# Patient Record
Sex: Female | Born: 1990 | Race: White | Hispanic: No | Marital: Single | Smoking: Former smoker
Health system: Southern US, Community
[De-identification: ages and names within clinical notes are randomized; demographics above are authoritative.]

## PROBLEM LIST (undated history)

## (undated) DIAGNOSIS — Z789 Other specified health status: Secondary | ICD-10-CM

## (undated) HISTORY — DX: Other specified health status: Z78.9

---

## 2015-05-31 ENCOUNTER — Encounter (INDEPENDENT_AMBULATORY_CARE_PROVIDER_SITE_OTHER): Payer: Self-pay

## 2015-05-31 ENCOUNTER — Ambulatory Visit (INDEPENDENT_AMBULATORY_CARE_PROVIDER_SITE_OTHER): Payer: 59 | Admitting: Physician Assistant

## 2015-05-31 VITALS — BP 114/55 | HR 70 | Temp 98.6°F | Resp 18 | Ht 73.0 in | Wt 230.0 lb

## 2015-05-31 DIAGNOSIS — K1379 Other lesions of oral mucosa: Secondary | ICD-10-CM

## 2015-05-31 MED ORDER — AMOXICILLIN 500 MG PO CAPS
500.0000 mg | ORAL_CAPSULE | Freq: Two times a day (BID) | ORAL | Status: AC
Start: 2015-05-31 — End: 2015-06-10

## 2015-05-31 NOTE — Progress Notes (Signed)
Subjective:    Patient ID: Diane Le is a 24 y.o. female.    HPI had tooth infection left upper 1st molar recently. Has been seen by a dentist. Since Sunday, her left cheek where it is close to the infected left upper 3rd molar became infection. The affected area is thickened and painful. No fever or other associated sym.       No Known Allergies    Past Medical History   Diagnosis Date   . No known health problems        No current outpatient prescriptions on file prior to visit.     No current facility-administered medications on file prior to visit.       The following portions of the patient's history were reviewed and updated as appropriate: allergies, current medications, past family history, past medical history, past social history, past surgical history and problem list.    Review of Systems   Constitutional: Negative for fever and fatigue.   HENT: Positive for dental problem.         Left cheek pain.    Respiratory: Negative for apnea, cough, choking, chest tightness, shortness of breath, wheezing and stridor.    Cardiovascular: Negative for chest pain and palpitations.   Gastrointestinal: Negative for abdominal pain.   Neurological: Negative for dizziness and headaches.         Objective:    BP 114/55 mmHg  Pulse 70  Temp(Src) 98.6 F (37 C) (Oral)  Resp 18  Ht 1.854 m (6\' 1" )  Wt 104.327 kg (230 lb)  BMI 30.35 kg/m2  LMP 05/24/2015 (Approximate)    Physical Exam   Constitutional: She appears well-developed and well-nourished.   HENT:   Head: Normocephalic and atraumatic.   Mouth/Throat:       Cardiovascular: Normal rate.    Pulmonary/Chest: Effort normal. No respiratory distress.   Neurological: She is alert.   Psychiatric: She has a normal mood and affect.   Nursing note and vitals reviewed.       Lab Results from today's visit:  No results found for this or any previous visit (from the past 12 hour(s)).    Radiology Results from today's visit:  No results found.           Assessment and Plan:       Diane Le was seen today for dental pain.    Diagnoses and all orders for this visit:    Infection of buccal space  -     amoxicillin (AMOXIL) 500 MG capsule; Take 1 capsule (500 mg total) by mouth 2 (two) times daily.    - will FU with dentist ASAP,           Please go to ER if symptoms persist, worsen or new symptoms develop. Follow up with your Primary Care Physician or Return to Clinic as needed. Patient/Family verbalizes understanding.    Danella Deis, PA  [facility]  05/31/2015  12:51 PM

## 2015-05-31 NOTE — Patient Instructions (Signed)
Please go to ER if symptoms persist, worsen or new symptoms develop. Follow up with your Primary Care Physician or Return to Clinic as needed. Patient/Family verbalizes understanding.      Understanding Healthy Teeth and Gums  When you look at your mouth in the mirror, you can see hard white teeth surrounded by soft gums. What you likely can't see is a sticky coating of bacteria and other substances on your teeth and gums. This coating, called plaque, can harm your mouth if it's not kept under control.    Healthy Teeth and Gums  To have good oral health, you musthave healthy teeth and gums.   Teeth are made of hard tissuedesigned to break up food. Healthy teeth can be various shades of white (some staining on the teeth is normal). Teeth are set into the supporting bone of the jaw.   Gums are soft tissues that cover bone and part of each tooth. Thecolor of your gums depends on your ethnicity. But, your gums should be the same color throughout your mouth.  When Plaque and Tartar Form  Even in a healthy mouth, plaque forms. If not cleared away with daily brushing and flossing, this sticky film coats the teeth, gums, and tongue.   As saliva and the tongue movein the mouth, some plaque is wiped off the tooth surfaces. But plaque can collect in the grooves of the teeth, between teeth, and at the gumline.   Plaque bacteria feed on bits of sugary and starchy foods left in your mouth after you eat. This results in acid, the main cause of tooth decay.   If not removed, plaque hardens intotartar (also called calculus). Tartar can spread below the gumline, where it damages the gums and bone.  Are You At Risk?  Some factors make you more likely to have problems with your teeth and gums. These include:   Not taking good care of your teeth and gums.   A low amount of saliva in the mouth, which allows plaque to collect.   Smoking, which makes your body lessable to fight infections such as gum disease. Smoking also  reduces the amount of saliva in your mouth.   Eating a lot of sugary and starchy foods,which causes more acid to form.   Frequent snacking, which lets acid form more often.   Crooked teeth, which can be harder to clean.   [address] [employer] [address], [place], [place] [address]. All rights reserved. This information is not intended as a substitute for professional medical care. Always follow your healthcare professional's instructions.

## 2015-06-01 ENCOUNTER — Telehealth (INDEPENDENT_AMBULATORY_CARE_PROVIDER_SITE_OTHER): Payer: Self-pay

## 2015-06-01 ENCOUNTER — Encounter (INDEPENDENT_AMBULATORY_CARE_PROVIDER_SITE_OTHER): Payer: Self-pay

## 2015-06-01 ENCOUNTER — Ambulatory Visit (INDEPENDENT_AMBULATORY_CARE_PROVIDER_SITE_OTHER): Payer: 59 | Admitting: Physician Assistant

## 2015-06-01 VITALS — BP 119/51 | HR 89 | Temp 98.6°F | Resp 18 | Ht 73.0 in | Wt 230.0 lb

## 2015-06-01 DIAGNOSIS — K1379 Other lesions of oral mucosa: Secondary | ICD-10-CM

## 2015-06-01 DIAGNOSIS — L0201 Cutaneous abscess of face: Secondary | ICD-10-CM

## 2015-06-01 DIAGNOSIS — Z5189 Encounter for other specified aftercare: Secondary | ICD-10-CM

## 2015-06-01 MED ORDER — CLINDAMYCIN HCL 300 MG PO CAPS
300.0000 mg | ORAL_CAPSULE | Freq: Three times a day (TID) | ORAL | Status: AC
Start: 2015-06-01 — End: 2015-06-08

## 2015-06-01 NOTE — Progress Notes (Signed)
Subjective:    Patient ID: Diane Le is a 24 y.o. female.    Abscess  This is a new problem. Episode onset: pt seen in clinic yesterday. The problem occurs constantly. The problem has been waxing and waning. Pertinent negatives include no chills, fever, nausea or vomiting. Exacerbated by: palpation.   Pt woke this morning with swelling from left cheek up to just under left eye. Pt applied warm tea bag to abscess in gum line and felt a popping sensation followed by purulent and bloody drainage. Pt reports moderate improvement in swelling that is returning since drainage occurred. Denies F/C/N/V/ vision changes. Pt has taken 3 doses of antibiotic along with tylenol/ibuprofen with minimal relief.    The following portions of the patient's history were reviewed and updated as appropriate: allergies, current medications, past family history, past medical history, past social history, past surgical history and problem list.    Review of Systems   Constitutional: Negative for fever and chills.   HENT: Positive for dental problem and facial swelling. Negative for ear pain.    Gastrointestinal: Negative for nausea and vomiting.   All other systems reviewed and are negative.        Objective:    BP 119/51 mmHg  Pulse 89  Temp(Src) 98.6 F (37 C) (Oral)  Resp 18  Ht 1.854 m (6\' 1" )  Wt 104.327 kg (230 lb)  BMI 30.35 kg/m2  LMP 05/24/2015 (Approximate)    Physical Exam   Constitutional: She is oriented to person, place, and time. She appears well-developed and well-nourished. No distress.   HENT:   Head:       Mouth/Throat: Uvula is midline.       Cardiovascular: Normal rate.    Pulmonary/Chest: Effort normal.   Neurological: She is alert and oriented to person, place, and time.   Skin: Skin is warm and dry. She is not diaphoretic.   Psychiatric: She has a normal mood and affect. Her behavior is normal.   Nursing note and vitals reviewed.       Labs  No results found for this or any previous visit  (from the past 24 hour(s)).    Radiology  No results found.        Assessment and Plan:       Naveah was seen today for abscess.    Diagnoses and all orders for this visit:    Abscess, cheek  -     clindamycin (CLEOCIN) 300 MG capsule; Take 1 capsule (300 mg total) by mouth 3 (three) times daily.    -will stop amoxicillin, start clindamycin  -will continue warm compresses supporting spontaneous drainage  -Follow up with PCP if symptoms lasting longer than expected  -RTC or ER if new/worsening symptoms  -Pt/guardian agreed to plan          Allie Bossier, PA  [facility]  06/01/2015  8:04 PM

## 2015-06-01 NOTE — Telephone Encounter (Signed)
-----   Message from Lolita Cram sent at 06/01/2015 11:23 AM EST -----  Contact: [phone]  Pt called stating she thinks the medicine isn't helping. Underneath her eye is now swollen.

## 2015-06-01 NOTE — Patient Instructions (Signed)
Follow up with your Primary Care Physician or Return to Clinic if symptoms persist or worsen. Patient/Family verbalizes understanding.    Abscess (Antibiotic Treatment Only)  An abscess (sometimes called a "boil") occurs when bacteria get trapped under the skin and begin to grow. Pus forms inside the abscess as the body responds to the bacteria. An abscess can occur with an insect bite, ingrown hair, blocked oil gland, pimple, cyst, or puncture wound.  In the early stages, redness and tenderness are the only symptoms. Sometimes, this stage can be treated with antibiotics alone. If the abscess does not respond to antibiotic treatment, it will need to be drained with a small cut, under local anesthesia.  Home care  The following will help you care for your abscess at home:   Soak the wound in hot water or apply hot packs (small towel soaked in hot water) to the area for 20 minutes at a time. Do this three to four times a day.   Apply antibiotic cream or ointment onto the skin 3-4 times a day, unless something else was prescribed. Some ointments include an antibiotic plus a local pain reliever.   If your doctor prescribed antibiotics, do not stop taking this medication until you have finished the prescribed course or the doctor tells you to stop.   You may use an over-the-counter pain medication to control pain, unless another pain medicine was prescribed. If you have chronic liver or kidney disease or ever had a stomach ulcer or GI bleeding, talk with your doctor before using these any of these.  Follow-up care  Follow up with your health care provider as advised by our staff. Look at your wound each day for the signs of worsening infection listed below.  When to seek medical advice  Call your health care provider right away if any of these occur:   An increase in redness or swelling   Red streaks in the skin leading away from the abscess   An increase in local pain or swelling   Fever of 100.85F (38C) or  higher, or as directed by your health care provider   Pus or fluid coming from the abscess   2000-2015 [employer] [address] [place], [place] [address]. All rights reserved. This information is not intended as a substitute for professional medical care. Always follow your healthcare professional's instructions.

## 2015-06-01 NOTE — Telephone Encounter (Signed)
Spoke with patient states she is having increased swelling under left eye and left upper jaw pain. States pain is a 7 out of 10. Patient is applying warm compresses and salt water rinses. Patient has taken 3 doses of Amoxicillin. She is asking if the antibiotic should be changed since she isnt feeling much better. Will discuss with provider and call patient back today.

## 2015-06-01 NOTE — Telephone Encounter (Signed)
Advised by Dr Carmela Hurt to have patient come back to clinic to be re-evaluated. Spoke with patient verbalized understanding and is planning on returning to clinic today. Atlee Abide

## 2015-06-04 ENCOUNTER — Telehealth (INDEPENDENT_AMBULATORY_CARE_PROVIDER_SITE_OTHER): Payer: Self-pay

## 2015-06-04 NOTE — Telephone Encounter (Signed)
Called and left message with patient to give us a call back with any questions or concerns

## 2015-06-05 ENCOUNTER — Telehealth (INDEPENDENT_AMBULATORY_CARE_PROVIDER_SITE_OTHER): Payer: Self-pay

## 2015-06-05 NOTE — Telephone Encounter (Signed)
Patient answered call. Patient stated she's "doing wonderful" and the medication regimen is working great. Patient advised to call/come back to office with any concerns.  Autumn N Bunch  11:14 AM

## 2016-12-22 ENCOUNTER — Encounter: Payer: Self-pay | Admitting: Advanced Practice Midwife

## 2017-02-16 ENCOUNTER — Encounter: Payer: Self-pay | Admitting: *Deleted

## 2017-02-16 LAB — PREGNANCY, URINE: Preg Test, Ur: POSITIVE

## 2017-02-18 ENCOUNTER — Encounter: Payer: Self-pay | Admitting: Obstetrics and Gynecology

## 2017-02-18 ENCOUNTER — Ambulatory Visit (INDEPENDENT_AMBULATORY_CARE_PROVIDER_SITE_OTHER): Payer: Medicaid Other | Admitting: Obstetrics and Gynecology

## 2017-02-18 ENCOUNTER — Other Ambulatory Visit (HOSPITAL_COMMUNITY)
Admission: RE | Admit: 2017-02-18 | Discharge: 2017-02-18 | Disposition: A | Discharge status: Home / Self Care | Payer: Medicaid Other | Source: Ambulatory Visit | Attending: Obstetrics and Gynecology | Admitting: Obstetrics and Gynecology

## 2017-02-18 DIAGNOSIS — Z113 Encounter for screening for infections with a predominantly sexual mode of transmission: Secondary | ICD-10-CM

## 2017-02-18 DIAGNOSIS — Z3480 Encounter for supervision of other normal pregnancy, unspecified trimester: Secondary | ICD-10-CM | POA: Insufficient documentation

## 2017-02-18 DIAGNOSIS — O0930 Supervision of pregnancy with insufficient antenatal care, unspecified trimester: Secondary | ICD-10-CM | POA: Insufficient documentation

## 2017-02-18 DIAGNOSIS — O0932 Supervision of pregnancy with insufficient antenatal care, second trimester: Secondary | ICD-10-CM | POA: Diagnosis not present

## 2017-02-18 DIAGNOSIS — O99210 Obesity complicating pregnancy, unspecified trimester: Secondary | ICD-10-CM

## 2017-02-18 DIAGNOSIS — Z3400 Encounter for supervision of normal first pregnancy, unspecified trimester: Secondary | ICD-10-CM

## 2017-02-18 DIAGNOSIS — Z23 Encounter for immunization: Secondary | ICD-10-CM

## 2017-02-18 DIAGNOSIS — O99212 Obesity complicating pregnancy, second trimester: Secondary | ICD-10-CM

## 2017-02-18 DIAGNOSIS — E669 Obesity, unspecified: Secondary | ICD-10-CM | POA: Insufficient documentation

## 2017-02-18 LAB — POCT URINALYSIS DIP (DEVICE)
Bilirubin Urine: NEGATIVE
GLUCOSE, UA: NEGATIVE mg/dL
Hgb urine dipstick: NEGATIVE
Ketones, ur: NEGATIVE mg/dL
NITRITE: NEGATIVE
PH: 6 (ref 5.0–8.0)
PROTEIN: NEGATIVE mg/dL
Specific Gravity, Urine: >=1.03 (ref 1.005–1.030)
UROBILINOGEN UA: 1 mg/dL (ref 0.0–1.0)

## 2017-02-18 NOTE — Progress Notes (Signed)
New OB Note  02/18/2017   [facility]  Chief Complaint: NOB  Transfer of Care Patient: no  History of Present Illness: Ms. Shannon Clark is a 26 y.o. G2P0010 @ 20/5 weeks (EDC 1/25, based on Patient's last menstrual period was 09/26/2016.=[facility]  Preg complicated by has Supervision of normal first pregnancy; Late prenatal care affecting pregnancy; Obesity (BMI 30-39.9); and Obesity in pregnancy on her problem list.   Any events prior to today's visit: no Her periods were: qmonth and regular She was using no method when she conceived.  She has Negative signs or symptoms of nausea/vomiting of pregnancy. She has Negative signs or symptoms of miscarriage or preterm labor On any medications around the time she conceived/early pregnancy: No   ROS: A 12-point review of systems was performed and negative, except as stated in the above HPI.  OBGYN History: As per HPI. OB History  Gravida Para Term Preterm AB Living  2       1    SAB TAB Ectopic Multiple Live Births  1            # Outcome Date GA Lbr Len/2nd Weight Sex Delivery Anes PTL Lv  2 Current           1 SAB 08/2016 [id number]             Any issues with any prior pregnancies: not applicable Prior children are healthy, doing well, and without any problems or issues: not applicable History of pap smears: Yes. Last pap smear unsure and results were negative   Past Medical History: History reviewed. No pertinent past medical history.  Past Surgical History: History reviewed. No pertinent surgical history.  Family History:  History reviewed. No pertinent family history. She denies any history of mental retardation, birth defects or genetic disorders in her or the FOB's history  Social History:  Social History   Social History  . Marital status: Single    Spouse name: N/A  . Number of children: N/A  . Years of education: N/A   Occupational History  .  Not on file.   Social History Main Topics  . Smoking status: Former Games developermoker  . Smokeless tobacco: Never Used     Comment: very little in college  . Alcohol use No  . Drug use: No  . Sexual activity: Yes    Birth control/ protection: None   Other Topics Concern  . Not on file   Social History Narrative  . No narrative on file    Allergy: No Known Allergies  Health Maintenance:  Mammogram Up to Date: not applicable  Current Outpatient Medications: PNV  Physical Exam:   BP 121/69   Pulse 76   Ht 6' (1.829 m)   Wt 245 lb 3.2 oz (111.2 kg)   LMP 09/26/2016   BMI 33.26 kg/m  Body mass index is 33.26 kg/m. Contractions: Not present Vag. Bleeding: None. Fundal height: 20 FHTs: 140s  General appearance: Well nourished, well developed female in no acute distress.  Neck:  Supple, normal appearance, and no thyromegaly  Cardiovascular: S1, S2 normal, no murmur, rub or gallop, regular rate and rhythm Respiratory:  Clear to auscultation bilateral. Normal respiratory effort Abdomen: positive bowel sounds and no masses, hernias; diffusely non tender to palpation, non distended Breasts: deferred (pt denies any s/s) Neuro/Psych:  Normal mood and affect.  Skin:  Warm and dry.  Lymphatic:  No inguinal lymphadenopathy.   Pelvic  exam: is not limited by body habitus EGBUS: within normal limits, Vagina: within normal limits and with no blood in the vault, Cervix: normal appearing cervix without discharge or lesions, closed/long/high, Uterus:  enlarged, c/w 20 week size, and Adnexa:  normal adnexa and no mass, fullness, tenderness  Laboratory: none  Imaging:  8/2 u/s put her at 14/6  Assessment: pt doing well  Plan: 1. Supervision of normal first pregnancy, antepartum Routine care. Declines afp tetra - Obstetric Panel, Including HIV - Culture, OB Urine - Enroll patient in Babyscripts Program - Cytology - PAP - Hemoglobinopathy evaluation - Cystic fibrosis gene test -  SMN1 Copy Number Analysis - [place] MFM OB DETAIL +14 WK; Future  2. Late prenatal care affecting pregnancy in second trimester  3. Obesity (BMI 30-39.9)  4. Obesity in pregnancy - CMP and Liver - TSH - Protein / Creatinine Ratio, Urine - HgB A1c  Problem list reviewed and updated.  Follow up in 3 weeks.  The nature of [facility] with multiple MDs and other Advanced Practice Providers was explained to patient; also emphasized that residents, students are part of our team.  >50% of 20 min visit spent on counseling and coordination of care.     Cornelia Copa MD Attending [facility])

## 2017-02-18 NOTE — Progress Notes (Signed)
Here for initial prenatal visit. Given new patient education booklets. Signed up for babyscripps app. Declines genetic testing.

## 2017-02-19 LAB — CYTOLOGY - PAP
CHLAMYDIA, DNA PROBE: NEGATIVE
DIAGNOSIS: NEGATIVE
NEISSERIA GONORRHEA: NEGATIVE
Trichomonas: NEGATIVE

## 2017-02-19 LAB — PROTEIN / CREATININE RATIO, URINE
CREATININE, UR: 214.5 mg/dL
Protein, Ur: 13.9 mg/dL
Protein/Creat Ratio: 65 mg/g creat (ref 0–200)

## 2017-02-19 NOTE — Addendum Note (Signed)
Addended by: Gerome ApleyZEYFANG, LINDA L on: 02/19/2017 09:59 AM   Modules accepted: Orders

## 2017-02-20 LAB — URINE CULTURE, OB REFLEX

## 2017-02-20 LAB — CULTURE, OB URINE

## 2017-02-24 ENCOUNTER — Ambulatory Visit (HOSPITAL_COMMUNITY)
Admission: RE | Admit: 2017-02-24 | Discharge: 2017-02-24 | Disposition: A | Discharge status: Home / Self Care | Payer: Medicaid Other | Source: Ambulatory Visit | Attending: Obstetrics and Gynecology | Admitting: Obstetrics and Gynecology

## 2017-02-24 ENCOUNTER — Other Ambulatory Visit: Payer: Self-pay | Admitting: Obstetrics and Gynecology

## 2017-02-24 DIAGNOSIS — Z363 Encounter for antenatal screening for malformations: Secondary | ICD-10-CM | POA: Insufficient documentation

## 2017-02-24 DIAGNOSIS — O0932 Supervision of pregnancy with insufficient antenatal care, second trimester: Secondary | ICD-10-CM

## 2017-02-24 DIAGNOSIS — Z3482 Encounter for supervision of other normal pregnancy, second trimester: Secondary | ICD-10-CM | POA: Diagnosis not present

## 2017-02-24 DIAGNOSIS — Z3A21 21 weeks gestation of pregnancy: Secondary | ICD-10-CM | POA: Insufficient documentation

## 2017-02-24 DIAGNOSIS — Z3689 Encounter for other specified antenatal screening: Secondary | ICD-10-CM | POA: Diagnosis present

## 2017-02-24 DIAGNOSIS — Z369 Encounter for antenatal screening, unspecified: Secondary | ICD-10-CM

## 2017-02-24 DIAGNOSIS — Z3400 Encounter for supervision of normal first pregnancy, unspecified trimester: Secondary | ICD-10-CM

## 2017-02-25 LAB — CMP AND LIVER
ALK PHOS: 46 IU/L (ref 39–117)
ALT: 10 IU/L (ref 0–32)
AST: 13 IU/L (ref 0–40)
Albumin: 3.8 g/dL (ref 3.5–5.5)
BUN: 7 mg/dL (ref 6–20)
Bilirubin, Direct: 0.07 mg/dL (ref 0.00–0.40)
CHLORIDE: 102 mmol/L (ref 96–106)
CO2: 23 mmol/L (ref 20–29)
CREATININE: 0.54 mg/dL — AB (ref 0.57–1.00)
Calcium: 8.9 mg/dL (ref 8.7–10.2)
GFR calc Af Amer: 152 mL/min/{1.73_m2} (ref >59)
GFR, EST NON AFRICAN AMERICAN: 132 mL/min/{1.73_m2} (ref >59)
Glucose: 66 mg/dL (ref 65–99)
POTASSIUM: 4 mmol/L (ref 3.5–5.2)
SODIUM: 139 mmol/L (ref 134–144)
TOTAL PROTEIN: 6.5 g/dL (ref 6.0–8.5)

## 2017-02-25 LAB — OBSTETRIC PANEL, INCLUDING HIV
ANTIBODY SCREEN: NEGATIVE
Basophils Absolute: 0 10*3/uL (ref 0.0–0.2)
Basos: 0 %
EOS (ABSOLUTE): 0.1 10*3/uL (ref 0.0–0.4)
EOS: 1 %
HEMATOCRIT: 37.2 % (ref 34.0–46.6)
HIV SCREEN 4TH GENERATION: NONREACTIVE
Hemoglobin: 12.4 g/dL (ref 11.1–15.9)
Hepatitis B Surface Ag: NEGATIVE
Immature Grans (Abs): 0 10*3/uL (ref 0.0–0.1)
Immature Granulocytes: 0 %
LYMPHS ABS: 1.8 10*3/uL (ref 0.7–3.1)
Lymphs: 17 %
MCH: 30.6 pg (ref 26.6–33.0)
MCHC: 33.3 g/dL (ref 31.5–35.7)
MCV: 92 fL (ref 79–97)
Monocytes Absolute: 0.6 10*3/uL (ref 0.1–0.9)
Monocytes: 5 %
NEUTROS ABS: 8.5 10*3/uL — AB (ref 1.4–7.0)
Neutrophils: 77 %
Platelets: 221 10*3/uL (ref 150–379)
RBC: 4.05 x10E6/uL (ref 3.77–5.28)
RDW: 13.4 % (ref 12.3–15.4)
RH TYPE: POSITIVE
RPR: NONREACTIVE
Rubella Antibodies, IGG: 2.41 index (ref >0.99)
WBC: 11 10*3/uL — AB (ref 3.4–10.8)

## 2017-02-25 LAB — HEMOGLOBIN A1C
Est. average glucose Bld gHb Est-mCnc: 91 mg/dL
HEMOGLOBIN A1C: 4.8 % (ref 4.8–5.6)

## 2017-02-25 LAB — TSH: TSH: 2.43 u[IU]/mL (ref 0.450–4.500)

## 2017-02-25 LAB — HEMOGLOBINOPATHY EVALUATION
HGB C: 0 %
HGB S: 0 %
HGB VARIANT: 0 %
Hemoglobin A2 Quantitation: 2.5 % (ref 1.8–3.2)
Hemoglobin F Quantitation: 0 % (ref 0.0–2.0)
Hgb A: 97.5 % (ref 96.4–98.8)

## 2017-02-25 LAB — CYSTIC FIBROSIS GENE TEST

## 2017-02-27 LAB — SMN1 COPY NUMBER ANALYSIS (SMA CARRIER SCREENING)

## 2017-03-11 ENCOUNTER — Ambulatory Visit (INDEPENDENT_AMBULATORY_CARE_PROVIDER_SITE_OTHER): Payer: Medicaid Other | Admitting: Obstetrics and Gynecology

## 2017-03-11 VITALS — BP 127/70 | HR 95 | Wt 255.3 lb

## 2017-03-11 DIAGNOSIS — O0932 Supervision of pregnancy with insufficient antenatal care, second trimester: Secondary | ICD-10-CM

## 2017-03-11 DIAGNOSIS — O99210 Obesity complicating pregnancy, unspecified trimester: Secondary | ICD-10-CM

## 2017-03-11 DIAGNOSIS — O99212 Obesity complicating pregnancy, second trimester: Secondary | ICD-10-CM

## 2017-03-11 DIAGNOSIS — E669 Obesity, unspecified: Secondary | ICD-10-CM

## 2017-03-11 DIAGNOSIS — Z3480 Encounter for supervision of other normal pregnancy, unspecified trimester: Secondary | ICD-10-CM

## 2017-03-11 NOTE — Progress Notes (Signed)
Medicaid home form completed 

## 2017-03-11 NOTE — Progress Notes (Signed)
   PRENATAL VISIT NOTE  Subjective:  Shannon Clark is a 26 y.o. G2P0010 at [redacted]w[redacted]d being seen today for ongoing prenatal care.  She is currently monitored for the following issues for this low-risk pregnancy and has Supervision of other normal pregnancy, antepartum; Late prenatal care affecting pregnancy; and Obesity in pregnancy on her problem list.  Patient reports no complaints.  Contractions: Not present. Vag. Bleeding: None.  Movement: Present. Denies leaking of fluid.   The following portions of the patient's history were reviewed and updated as appropriate: allergies, current medications, past family history, past medical history, past social history, past surgical history and problem list. Problem list updated.  Objective:   Vitals:   03/11/17 1307  BP: 127/70  Pulse: 95  Weight: 255 lb 4.8 oz (115.8 kg)    Fetal Status: Fetal Heart Rate (bpm): 157 Fundal Height: 23 cm Movement: Present     General:  Alert, oriented and cooperative. Patient is in no acute distress.  Skin: Skin is warm and dry. No rash noted.   Cardiovascular: Normal heart rate noted  Respiratory: Normal respiratory effort, no problems with respiration noted  Abdomen: Soft, gravid, appropriate for gestational age.  Pain/Pressure: Absent     Pelvic: Cervical exam deferred        Extremities: Normal range of motion.  Edema: Trace  Mental Status:  Normal mood and affect. Normal behavior. Normal judgment and thought content.   Assessment and Plan:  Pregnancy: G2P0010 at [redacted]w[redacted]d  1. Supervision of other normal pregnancy, antepartum - declines flu today - reviewed GTT and 3rd trimester labs, Tdap recommendation - needs follow up to anatomy for incomplete scans - [place] MFM OB FOLLOW UP; Future - gave info for area pediatricians  2. Obesity in pregnancy Reviewed appropriate weight gain for pregnancy Encouraged to continue moderate exercise  Preterm labor symptoms and general obstetric precautions including  but not limited to vaginal bleeding, contractions, leaking of fluid and fetal movement were reviewed in detail with the patient. Please refer to After Visit Summary for other counseling recommendations.  Return in about 3 weeks (around 04/01/2017) for LOB.   Conan Bowens, MD

## 2017-03-11 NOTE — Patient Instructions (Addendum)
Second Trimester of Pregnancy The second trimester is from week 14 through week 27 (months 4 through 6). The second trimester is often a time when you feel your best. Your body has adjusted to being pregnant, and you begin to feel better physically. Usually, morning sickness has lessened or quit completely, you may have more energy, and you may have an increase in appetite. The second trimester is also a time when the fetus is growing rapidly. At the end of the sixth month, the fetus is about 9 inches long and weighs about 1 pounds. You will likely begin to feel the baby move (quickening) between 16 and 20 weeks of pregnancy. Body changes during your second trimester Your body continues to go through many changes during your second trimester. The changes vary from woman to woman.  Your weight will continue to increase. You will notice your lower abdomen bulging out.  You may begin to get stretch marks on your hips, abdomen, and breasts.  You may develop headaches that can be relieved by medicines. The medicines should be approved by your health care provider.  You may urinate more often because the fetus is pressing on your bladder.  You may develop or continue to have heartburn as a result of your pregnancy.  You may develop constipation because certain hormones are causing the muscles that push waste through your intestines to slow down.  You may develop hemorrhoids or swollen, bulging veins (varicose veins).  You may have back pain. This is caused by:  Weight gain.  Pregnancy hormones that are relaxing the joints in your pelvis.  A shift in weight and the muscles that support your balance.  Your breasts will continue to grow and they will continue to become tender.  Your gums may bleed and may be sensitive to brushing and flossing.  Dark spots or blotches (chloasma, mask of pregnancy) may develop on your face. This will likely fade after the baby is born.  A dark line from your  belly button to the pubic area (linea nigra) may appear. This will likely fade after the baby is born.  You may have changes in your hair. These can include thickening of your hair, rapid growth, and changes in texture. Some women also have hair loss during or after pregnancy, or hair that feels dry or thin. Your hair will most likely return to normal after your baby is born. What to expect at prenatal visits During a routine prenatal visit:  You will be weighed to make sure you and the fetus are growing normally.  Your blood pressure will be taken.  Your abdomen will be measured to track your baby's growth.  The fetal heartbeat will be listened to.  Any test results from the previous visit will be discussed. Your health care provider may ask you:  How you are feeling.  If you are feeling the baby move.  If you have had any abnormal symptoms, such as leaking fluid, bleeding, severe headaches, or abdominal cramping.  If you are using any tobacco products, including cigarettes, chewing tobacco, and electronic cigarettes.  If you have any questions. Other tests that may be performed during your second trimester include:  Blood tests that check for:  Low iron levels (anemia).  High blood sugar that affects pregnant women (gestational diabetes) between 24 and 28 weeks.  Rh antibodies. This is to check for a protein on red blood cells (Rh factor).  Urine tests to check for infections, diabetes, or protein in the   urine.  An ultrasound to confirm the proper growth and development of the baby.  An amniocentesis to check for possible genetic problems.  Fetal screens for spina bifida and Down syndrome.  HIV (human immunodeficiency virus) testing. Routine prenatal testing includes screening for HIV, unless you choose not to have this test. Follow these instructions at home: Medicines   Follow your health care provider's instructions regarding medicine use. Specific medicines may  be either safe or unsafe to take during pregnancy.  Take a prenatal vitamin that contains at least 600 micrograms (mcg) of folic acid.  If you develop constipation, try taking a stool softener if your health care provider approves. Eating and drinking   Eat a balanced diet that includes fresh fruits and vegetables, whole grains, good sources of protein such as meat, eggs, or tofu, and low-fat dairy. Your health care provider will help you determine the amount of weight gain that is right for you.  Avoid raw meat and uncooked cheese. These carry germs that can cause birth defects in the baby.  If you have low calcium intake from food, talk to your health care provider about whether you should take a daily calcium supplement.  Limit foods that are high in fat and processed sugars, such as fried and sweet foods.  To prevent constipation:  Drink enough fluid to keep your urine clear or pale yellow.  Eat foods that are high in fiber, such as fresh fruits and vegetables, whole grains, and beans. Activity   Exercise only as directed by your health care provider. Most women can continue their usual exercise routine during pregnancy. Try to exercise for 30 minutes at least 5 days a week. Stop exercising if you experience uterine contractions.  Avoid heavy lifting, wear low heel shoes, and practice good posture.  A sexual relationship may be continued unless your health care provider directs you otherwise. Relieving pain and discomfort   Wear a good support bra to prevent discomfort from breast tenderness.  Take warm sitz baths to soothe any pain or discomfort caused by hemorrhoids. Use hemorrhoid cream if your health care provider approves.  Rest with your legs elevated if you have leg cramps or low back pain.  If you develop varicose veins, wear support hose. Elevate your feet for 15 minutes, 3-4 times a day. Limit salt in your diet. Prenatal Care   Write down your questions. Take them  to your prenatal visits.  Keep all your prenatal visits as told by your health care provider. This is important. Safety   Wear your seat belt at all times when driving.  Make a list of emergency phone numbers, including numbers for family, friends, the hospital, and police and fire departments. General instructions   Ask your health care provider for a referral to a local prenatal education class. Begin classes no later than the beginning of month 6 of your pregnancy.  Ask for help if you have counseling or nutritional needs during pregnancy. Your health care provider can offer advice or refer you to specialists for help with various needs.  Do not use hot tubs, steam rooms, or saunas.  Do not douche or use tampons or scented sanitary pads.  Do not cross your legs for long periods of time.  Avoid cat litter boxes and soil used by cats. These carry germs that can cause birth defects in the baby and possibly loss of the fetus by miscarriage or stillbirth.  Avoid all smoking, herbs, alcohol, and unprescribed drugs. Chemicals in   these products can affect the formation and growth of the baby.  Do not use any products that contain nicotine or tobacco, such as cigarettes and e-cigarettes. If you need help quitting, ask your health care provider.  Visit your dentist if you have not gone yet during your pregnancy. Use a soft toothbrush to brush your teeth and be gentle when you floss. Contact a health care provider if:  You have dizziness.  You have mild pelvic cramps, pelvic pressure, or nagging pain in the abdominal area.  You have persistent nausea, vomiting, or diarrhea.  You have a bad smelling vaginal discharge.  You have pain when you urinate. Get help right away if:  You have a fever.  You are leaking fluid from your vagina.  You have spotting or bleeding from your vagina.  You have severe abdominal cramping or pain.  You have rapid weight gain or weight loss.  You  have shortness of breath with chest pain.  You notice sudden or extreme swelling of your face, hands, ankles, feet, or legs.  You have not felt your baby move in over an hour.  You have severe headaches that do not go away when you take medicine.  You have vision changes. Summary  The second trimester is from week 14 through week 27 (months 4 through 6). It is also a time when the fetus is growing rapidly.  Your body goes through many changes during pregnancy. The changes vary from woman to woman.  Avoid all smoking, herbs, alcohol, and unprescribed drugs. These chemicals affect the formation and growth your baby.  Do not use any tobacco products, such as cigarettes, chewing tobacco, and e-cigarettes. If you need help quitting, ask your health care provider.  Contact your health care provider if you have any questions. Keep all prenatal visits as told by your health care provider. This is important. This information is not intended to replace advice given to you by your health care provider. Make sure you discuss any questions you have with your health care provider. Document Released: 05/20/2001 Document Revised: 11/01/2015 Document Reviewed: 07/27/2012 Elsevier Interactive Patient Education  2017 Elsevier Inc.   AREA PEDIATRIC/FAMILY PRACTICE PHYSICIANS   [facility] [address], [address] [place], [place]  [address] Phone - [phone]   Fax - [phone]  [facility] [address] [place], [place] [address] Phone - [phone]   Fax - [phone]  [facility] AM[facility] [address] [place], [place]  [address] Phone - [phone]   Fax - [phone]  [facility] [address] [place], [place]  [address] Phone - [phone]   Fax - [phone]  [facility] [address] [place], [place]  [address] Phone - [phone]   Fax - [phone]  [facility] [address] [place], [place]  [address] Phone - [phone]   Fax - [phone]  [facility] [address] [place], [place]  [address] Phone - [phone]   Fax - [phone]  [facility] [address], [address] [place], [place]  [address] Phone - [phone]   Fax - [phone]  [facility] [address] [place], [place]  [address] Phone - [phone]   Fax - [phone] [facility] [address] [place], [place]  [address] Phone - [phone]   Fax - [phone]  [facility] [address] [place], [place]  [address] Phone - [phone]   Fax - [phone]  [facility] [address] [place], [place]  [address] Phone - [phone]   Fax - [phone]  [facility] [address] [place], [place]  [address] Phone - [phone]   Fax - [phone]  [facility]IL[facility] GOSRANI [address] [place], [place]  [address] Phone - [phone]  [facility] [address] [place], [place]  [address] Phone - [phone]   Fax - [phone]  [facility] [address] [place], [place]  [address] Phone - [phone]   Fax - [phone]  [facility] [address] [place], [place]  [address] Phone - [phone]   Fax - [phone]  [facility] [address] [place], [place]  [address] Phone - [phone]   Fax - [phone]   [facility] [address] [place], [place]  [address] Phone - [phone]   Fax - [phone]  [facility] [address] [place], [place]  [address] Phone - [phone]   Fax - [phone]  DAVID RUBIN [address] [place], [place]  [address] Phone - [phone]   Fax -  [phone]  [facility] [address] [place], [place]  [address] Phone - [phone]   Fax - [phone]  [facility] [address] , [place]  [address] Phone - [phone]   Fax - [phone] [facility] [address] [place], [place]  [address] Phone - [phone]   Fax - [phone]  [facility] [address] [place], [place]  [address] Phone - [phone]   Fax - [phone]  [facility] [address] [place], [place]  [address] Phone - [phone]   Fax - [phone]  [facility] [place] Charlene Flemming MD [address] [place]  [address] Phone [phone]  Fax [phone]   

## 2017-03-11 NOTE — Progress Notes (Signed)
Declines flu vaccine today.

## 2017-03-24 ENCOUNTER — Other Ambulatory Visit: Payer: Self-pay | Admitting: Obstetrics and Gynecology

## 2017-03-24 ENCOUNTER — Ambulatory Visit (HOSPITAL_COMMUNITY)
Admission: RE | Admit: 2017-03-24 | Discharge: 2017-03-24 | Disposition: A | Discharge status: Home / Self Care | Payer: Medicaid Other | Source: Ambulatory Visit | Attending: Obstetrics and Gynecology | Admitting: Obstetrics and Gynecology

## 2017-03-24 DIAGNOSIS — Z3480 Encounter for supervision of other normal pregnancy, unspecified trimester: Secondary | ICD-10-CM

## 2017-03-24 DIAGNOSIS — Z3A25 25 weeks gestation of pregnancy: Secondary | ICD-10-CM | POA: Diagnosis not present

## 2017-03-24 DIAGNOSIS — Z3482 Encounter for supervision of other normal pregnancy, second trimester: Secondary | ICD-10-CM | POA: Diagnosis not present

## 2017-03-27 ENCOUNTER — Encounter: Payer: Self-pay | Admitting: *Deleted

## 2017-04-06 ENCOUNTER — Ambulatory Visit (INDEPENDENT_AMBULATORY_CARE_PROVIDER_SITE_OTHER): Payer: Medicaid Other | Admitting: Advanced Practice Midwife

## 2017-04-06 VITALS — BP 132/78 | HR 98 | Wt 264.2 lb

## 2017-04-06 DIAGNOSIS — Z23 Encounter for immunization: Secondary | ICD-10-CM

## 2017-04-06 DIAGNOSIS — Z3482 Encounter for supervision of other normal pregnancy, second trimester: Secondary | ICD-10-CM

## 2017-04-06 DIAGNOSIS — Z3480 Encounter for supervision of other normal pregnancy, unspecified trimester: Secondary | ICD-10-CM

## 2017-04-06 NOTE — Patient Instructions (Signed)
AREA PEDIATRIC/FAMILY PRACTICE PHYSICIANS  [facility] [address] [place], [place]  [address] Phone - [phone]   Fax - [phone]  [facility] [address] [place], [place] [address] Phone - [phone]   Fax - [phone]  J[facility] AM[facility] [address] [place], [place]  [address] Phone - [phone]   Fax - [phone]  [facility] [address] [place], [place]  [address] Phone - [phone]   Fax - [phone]  [facility] [address] [place], [place]  [address] Phone - [phone]   Fax - [phone]  [facility] [address] [place], [place]  [address] Phone - [phone]   Fax - [phone]  [facility] [address] [place], [place]  [address] Phone - [phone]   Fax - [phone]  [facility] [address] [place], [place]  [address] Phone - [phone]   Fax - [phone]  [facility] [address] [place], [place]  [address] Phone - [phone]   Fax - [phone] [facility] [address] [place], [place]  [address] Phone - [phone]   Fax - [phone]  [facility] [address] [place], [place]  [address] Phone - [phone]   Fax - [phone]  [facility] [address] [place], [place]  [address] Phone - [phone]   Fax - [phone]  [facility] [address] [place], [place]  [address] Phone - [phone]   Fax - [phone]  SHILPA GOSRANI [address] [place], [place]  [address] Phone - [phone]  [facility] [address] [place], [place]  [address] Phone - [phone]   Fax - [phone]  [facility] [address] [place], [place]  [address] Phone - [phone]   Fax - [phone]  [facility] [address] [place], [place]  [address] Phone - [phone]   Fax - [phone]  [facility] [address] [place], [place]  [address] Phone - [phone]   Fax - [phone]   [facility] [address] [place], [place]  [address] Phone - [phone]   Fax - [phone]  [facility] [address] [place], [place]  [address] Phone - [phone]   Fax - [phone]  [facility]UB[facility] [address] [place], [place]  [address] Phone - [phone]   Fax - [phone]  [facility] [address] [place], [place]  [address] Phone - [phone]   Fax - [phone]  [facility] [address] , [place]  [address] Phone - [phone]   Fax - [phone] [facility] [address] [place], [place]  [address] Phone - [phone]   Fax - [phone]  [facility] [address] [place], [place]  [address] Phone - [phone]   Fax - [phone]  [facility] [address] [place],   [address] Phone - [phone]   Fax - [phone]  [facility] Charlene Flemming MD [address] [place]  [address] Phone [phone]  Fax [phone]   

## 2017-04-06 NOTE — Progress Notes (Signed)
   PRENATAL VISIT NOTE  Subjective:  Shannon Clark is a 26 y.o. G2P0010 at 3831w3d being seen today for ongoing prenatal care.  She is currently monitored for the following issues for this low-risk pregnancy and has Supervision of other normal pregnancy, antepartum; Late prenatal care affecting pregnancy; and Obesity in pregnancy on her problem list.  Patient reports no complaints.  Contractions: Not present. Vag. Bleeding: None.  Movement: Present. Denies leaking of fluid.   The following portions of the patient's history were reviewed and updated as appropriate: allergies, current medications, past family history, past medical history, past social history, past surgical history and problem list. Problem list updated.  Objective:   Vitals:   04/06/17 1249  BP: 132/78  Pulse: 98  Weight: 264 lb 3.2 oz (119.8 kg)    Fetal Status: Fetal Heart Rate (bpm): 160   Movement: Present     FHT: 160 FH: 28  General:  Alert, oriented and cooperative. Patient is in no acute distress.  Skin: Skin is warm and dry. No rash noted.   Cardiovascular: Normal heart rate noted  Respiratory: Normal respiratory effort, no problems with respiration noted  Abdomen: Soft, gravid, appropriate for gestational age.  Pain/Pressure: Present     Pelvic: Cervical exam deferred        Extremities: Normal range of motion.  Edema: Mild pitting, slight indentation  Mental Status:  Normal mood and affect. Normal behavior. Normal judgment and thought content.   Assessment and Plan:  Pregnancy: G2P0010 at 4931w3d  1. Supervision of other normal pregnancy, antepartum - Needs 2 hour GTT at next visit (pt is not fasting today) - Tdap today    Preterm labor symptoms and general obstetric precautions including but not limited to vaginal bleeding, contractions, leaking of fluid and fetal movement were reviewed in detail with the patient. Please refer to After Visit Summary for other counseling recommendations.  Return  in about 2 weeks (around 04/20/2017).   Thressa ShellerHeather Efrata Brunner, CNM

## 2017-04-06 NOTE — Addendum Note (Signed)
Addended by: Garret ReddishBARNES, Garren Greenman M on: 04/06/2017 02:31 PM   Modules accepted: Orders

## 2017-04-15 ENCOUNTER — Encounter (HOSPITAL_COMMUNITY): Payer: Self-pay | Admitting: Emergency Medicine

## 2017-04-15 ENCOUNTER — Other Ambulatory Visit: Payer: Self-pay

## 2017-04-15 DIAGNOSIS — O9989 Other specified diseases and conditions complicating pregnancy, childbirth and the puerperium: Secondary | ICD-10-CM | POA: Insufficient documentation

## 2017-04-15 DIAGNOSIS — H6123 Impacted cerumen, bilateral: Secondary | ICD-10-CM | POA: Insufficient documentation

## 2017-04-15 DIAGNOSIS — Z3A29 29 weeks gestation of pregnancy: Secondary | ICD-10-CM | POA: Diagnosis not present

## 2017-04-15 DIAGNOSIS — Z87891 Personal history of nicotine dependence: Secondary | ICD-10-CM | POA: Insufficient documentation

## 2017-04-15 NOTE — ED Triage Notes (Signed)
C/o R ear pain since yesterday.  Pt is [redacted] weeks pregnant.

## 2017-04-16 ENCOUNTER — Ambulatory Visit (INDEPENDENT_AMBULATORY_CARE_PROVIDER_SITE_OTHER): Payer: Medicaid Other | Admitting: Otolaryngology

## 2017-04-16 ENCOUNTER — Emergency Department (HOSPITAL_COMMUNITY)
Admission: EM | Admit: 2017-04-16 | Discharge: 2017-04-16 | Disposition: A | Discharge status: Home / Self Care | Payer: Medicaid Other | Attending: Emergency Medicine | Admitting: Emergency Medicine

## 2017-04-16 DIAGNOSIS — H6123 Impacted cerumen, bilateral: Secondary | ICD-10-CM

## 2017-04-16 DIAGNOSIS — H900 Conductive hearing loss, bilateral: Secondary | ICD-10-CM | POA: Diagnosis not present

## 2017-04-16 DIAGNOSIS — Z3492 Encounter for supervision of normal pregnancy, unspecified, second trimester: Secondary | ICD-10-CM

## 2017-04-16 NOTE — ED Provider Notes (Signed)
[facility]   CSN: [id number] Arrival date & time: 04/15/17  2250     History   Chief Complaint Chief Complaint  Patient presents with  . Otalgia    HPI Shannon Clark is a 26 y.o. female.  The history is provided by the patient.  She is [redacted] weeks pregnant with pregnancy course having been uncomplicated to this point.  She comes in with a 2-day history of right ear pain which is getting worse.  Pain is rated at 8/10.  It feels better if she pulls on her ear.  She denies fever or chills.  There has been slight decreased hearing.  She denies rhinorrhea or sore throat.  She did try using some earwax removal products without success.  History reviewed. No pertinent past medical history.  Patient Active Problem List   Diagnosis Date Noted  . Supervision of other normal pregnancy, antepartum 02/18/2017  . Late prenatal care affecting pregnancy 02/18/2017  . Obesity in pregnancy 02/18/2017    History reviewed. No pertinent surgical history.  OB History    Gravida Para Term Preterm AB Living   2       1     SAB TAB Ectopic Multiple Live Births   1               Home Medications    Prior to Admission medications   Medication Sig Start Date End Date Taking? Authorizing Provider  Prenatal Multivit-Min-Fe-FA (PRENATAL VITAMINS PO) Take 1 tablet by mouth daily.    [provider]    Family History No family history on file.  Social History Social History   Tobacco Use  . Smoking status: Former Games developermoker  . Smokeless tobacco: Never Used  . Tobacco comment: very little in college  Substance Use Topics  . Alcohol use: No  . Drug use: No     Allergies   Patient has no known allergies.   Review of Systems Review of Systems  All other systems reviewed and are negative.    Physical Exam Updated Vital Signs BP 125/66 (BP Location: Right Arm)   Pulse 73   Temp 97.8 F (36.6 C) (Oral)   Resp 18    LMP 09/26/2016   SpO2 100%   Physical Exam  Nursing note and vitals reviewed.  26 year old female, resting comfortably and in no acute distress. Vital signs are normal. Oxygen saturation is 100%, which is normal. Head is normocephalic and atraumatic. PERRLA, EOMI. Oropharynx is clear.  Tympanic membranes obscured by cerumen impaction bilaterally. Neck is nontender and supple without adenopathy or JVD. Back is nontender and there is no CVA tenderness. Lungs are clear without rales, wheezes, or rhonchi. Chest is nontender. Heart has regular rate and rhythm without murmur. Abdomen is soft, flat, nontender with with gravid uterus present consistent with dates.  There are no other masses or hepatosplenomegaly and peristalsis is normoactive. Extremities have 1-2+ edema, full range of motion is present. Skin is warm and dry without rash. Neurologic: Mental status is normal, cranial nerves are intact, there are no motor or sensory deficits.  ED Treatments / Results   Procedures Procedures (including critical care time)  Medications Ordered in ED Medications - No data to display   Initial Impression / Assessment and Plan / ED Course  I have reviewed the triage vital signs and the nursing notes.  Right ear pain with bilateral cerumen impaction.  Old records reviewed, and the only visits  are for routine prenatal care.  Right ear is irrigated and wax removed with an ear curette.  Unfortunately, she developed significant pain and some mild bleeding.  Procedure was discontinued and she is discharged with referral to ENT for cerumen removal..  Final Clinical Impressions(s) / ED Diagnoses   Final diagnoses:  Bilateral impacted cerumen  Second trimester pregnancy    ED Discharge Orders    None       Dione BoozeGlick, Kveon Casanas, MD 04/16/17 [phone]

## 2017-04-21 ENCOUNTER — Ambulatory Visit (INDEPENDENT_AMBULATORY_CARE_PROVIDER_SITE_OTHER): Payer: Medicaid Other | Admitting: Medical

## 2017-04-21 ENCOUNTER — Encounter: Payer: Self-pay | Admitting: Medical

## 2017-04-21 VITALS — BP 114/59 | HR 79 | Wt 267.8 lb

## 2017-04-21 DIAGNOSIS — Z3483 Encounter for supervision of other normal pregnancy, third trimester: Secondary | ICD-10-CM

## 2017-04-21 DIAGNOSIS — Z3480 Encounter for supervision of other normal pregnancy, unspecified trimester: Secondary | ICD-10-CM

## 2017-04-21 NOTE — Patient Instructions (Signed)
Fetal Movement Counts °Patient Name: ________________________________________________ Patient Due Date: ____________________ °What is a fetal movement count? °A fetal movement count is the number of times that you feel your baby move during a certain amount of time. This may also be called a fetal kick count. A fetal movement count is recommended for every pregnant woman. You may be asked to start counting fetal movements as early as week 28 of your pregnancy. °Pay attention to when your baby is most active. You may notice your baby's sleep and wake cycles. You may also notice things that make your baby move more. You should do a fetal movement count: °· When your baby is normally most active. °· At the same time each day. ° °A good time to count movements is while you are resting, after having something to eat and drink. °How do I count fetal movements? °1. Find a quiet, comfortable area. Sit, or lie down on your side. °2. Write down the date, the start time and stop time, and the number of movements that you felt between those two times. Take this information with you to your health care visits. °3. For 2 hours, count kicks, flutters, swishes, rolls, and jabs. You should feel at least 10 movements during 2 hours. °4. You may stop counting after you have felt 10 movements. °5. If you do not feel 10 movements in 2 hours, have something to eat and drink. Then, keep resting and counting for 1 hour. If you feel at least 4 movements during that hour, you may stop counting. °Contact a health care provider if: °· You feel fewer than 4 movements in 2 hours. °· Your baby is not moving like he or she usually does. °Date: ____________ Start time: ____________ Stop time: ____________ Movements: ____________ °Date: ____________ Start time: ____________ Stop time: ____________ Movements: ____________ °Date: ____________ Start time: ____________ Stop time: ____________ Movements: ____________ °Date: ____________ Start time:  ____________ Stop time: ____________ Movements: ____________ °Date: ____________ Start time: ____________ Stop time: ____________ Movements: ____________ °Date: ____________ Start time: ____________ Stop time: ____________ Movements: ____________ °Date: ____________ Start time: ____________ Stop time: ____________ Movements: ____________ °Date: ____________ Start time: ____________ Stop time: ____________ Movements: ____________ °Date: ____________ Start time: ____________ Stop time: ____________ Movements: ____________ °This information is not intended to replace advice given to you by your health care provider. Make sure you discuss any questions you have with your health care provider. °Document Released: 06/25/2006 Document Revised: 01/23/2016 Document Reviewed: 07/05/2015 °Elsevier Interactive Patient Education © 2018 [employer] °Braxton Hicks Contractions °Contractions of the uterus can occur throughout pregnancy, but they are not always a sign that you are in labor. You may have practice contractions called Braxton Hicks contractions. These false labor contractions are sometimes confused with true labor. °What are Braxton Hicks contractions? °Braxton Hicks contractions are tightening movements that occur in the muscles of the uterus before labor. Unlike true labor contractions, these contractions do not result in opening (dilation) and thinning of the cervix. Toward the end of pregnancy (32-34 weeks), Braxton Hicks contractions can happen more often and may become stronger. These contractions are sometimes difficult to tell apart from true labor because they can be very uncomfortable. You should not feel embarrassed if you go to the hospital with false labor. °Sometimes, the only way to tell if you are in true labor is for your health care provider to look for changes in the cervix. The health care provider will do a physical exam and may monitor your contractions. If   you are not in true labor, the exam  should show that your cervix is not dilating and your water has not broken. °If there are no prenatal problems or other health problems associated with your pregnancy, it is completely safe for you to be sent home with false labor. You may continue to have Braxton Hicks contractions until you go into true labor. °How can I tell the difference between true labor and false labor? °· Differences °? False labor °? Contractions last 30-70 seconds.: Contractions are usually shorter and not as strong as true labor contractions. °? Contractions become very regular.: Contractions are usually irregular. °? Discomfort is usually felt in the top of the uterus, and it spreads to the lower abdomen and low back.: Contractions are often felt in the front of the lower abdomen and in the groin. °? Contractions do not go away with walking.: Contractions may go away when you walk around or change positions while lying down. °? Contractions usually become more intense and increase in frequency.: Contractions get weaker and are shorter-lasting as time goes on. °? The cervix dilates and gets thinner.: The cervix usually does not dilate or become thin. °Follow these instructions at home: °· Take over-the-counter and prescription medicines only as told by your health care provider. °· Keep up with your usual exercises and follow other instructions from your health care provider. °· Eat and drink lightly if you think you are going into labor. °· If Braxton Hicks contractions are making you uncomfortable: °? Change your position from lying down or resting to walking, or change from walking to resting. °? Sit and rest in a tub of warm water. °? Drink enough fluid to keep your urine clear or pale yellow. Dehydration may cause these contractions. °? Do slow and deep breathing several times an hour. °· Keep all follow-up prenatal visits as told by your health care provider. This is important. °Contact a health care provider if: °· You have a  fever. °· You have continuous pain in your abdomen. °Get help right away if: °· Your contractions become stronger, more regular, and closer together. °· You have fluid leaking or gushing from your vagina. °· You pass blood-tinged mucus (bloody show). °· You have bleeding from your vagina. °· You have low back pain that you never had before. °· You feel your baby’s head pushing down and causing pelvic pressure. °· Your baby is not moving inside you as much as it used to. °Summary °· Contractions that occur before labor are called Braxton Hicks contractions, false labor, or practice contractions. °· Braxton Hicks contractions are usually shorter, weaker, farther apart, and less regular than true labor contractions. True labor contractions usually become progressively stronger and regular and they become more frequent. °· Manage discomfort from Braxton Hicks contractions by changing position, resting in a warm bath, drinking plenty of water, or practicing deep breathing. °This information is not intended to replace advice given to you by your health care provider. Make sure you discuss any questions you have with your health care provider. °Document Released: 05/26/2005 Document Revised: 04/14/2016 Document Reviewed: 04/14/2016 °Elsevier Interactive Patient Education © 2017 [employer] ° °

## 2017-04-21 NOTE — Progress Notes (Signed)
   PRENATAL VISIT NOTE  Subjective:  Shannon Clark is a 26 y.o. G2P0010 at 6864w4d being seen today for ongoing prenatal care.  She is currently monitored for the following issues for this low-risk pregnancy and has Supervision of other normal pregnancy, antepartum; Late prenatal care affecting pregnancy; and Obesity in pregnancy on their problem list.  Patient reports no complaints.  Contractions: Not present. Vag. Bleeding: None.  Movement: Present. Denies leaking of fluid.   The following portions of the patient's history were reviewed and updated as appropriate: allergies, current medications, past family history, past medical history, past social history, past surgical history and problem list. Problem list updated.  Objective:   Vitals:   04/21/17 0743  BP: (!) 114/59  Pulse: 79  Weight: 267 lb 12.8 oz (121.5 kg)    Fetal Status: Fetal Heart Rate (bpm): 146 Fundal Height: 29 cm Movement: Present     General:  Alert, oriented and cooperative. Patient is in no acute distress.  Skin: Skin is warm and dry. No rash noted.   Cardiovascular: Normal heart rate noted  Respiratory: Normal respiratory effort, no problems with respiration noted  Abdomen: Soft, gravid, appropriate for gestational age.  Pain/Pressure: Absent     Pelvic: Cervical exam deferred        Extremities: Normal range of motion.  Edema: Trace  Mental Status:  Normal mood and affect. Normal behavior. Normal judgment and thought content.   Assessment and Plan:  Pregnancy: G2P0010 at 7764w4d  1. Supervision of other normal pregnancy, antepartum - Glucose Tolerance, 2 Hours w/1 Hour - CBC - RPR - HIV antibody  Preterm labor symptoms and general obstetric precautions including but not limited to vaginal bleeding, contractions, leaking of fluid and fetal movement were reviewed in detail with the patient. Please refer to After Visit Summary for other counseling recommendations.  Return in about 2 weeks (around  05/05/2017) for LOB.   Vonzella NippleJulie Molley Houser, PA-C

## 2017-04-22 LAB — CBC
HEMOGLOBIN: 12.3 g/dL (ref 11.1–15.9)
Hematocrit: 37.8 % (ref 34.0–46.6)
MCH: 30.7 pg (ref 26.6–33.0)
MCHC: 32.5 g/dL (ref 31.5–35.7)
MCV: 94 fL (ref 79–97)
Platelets: 196 10*3/uL (ref 150–379)
RBC: 4.01 x10E6/uL (ref 3.77–5.28)
RDW: 13.4 % (ref 12.3–15.4)
WBC: 10.6 10*3/uL (ref 3.4–10.8)

## 2017-04-22 LAB — HIV ANTIBODY (ROUTINE TESTING W REFLEX): HIV Screen 4th Generation wRfx: NONREACTIVE

## 2017-04-22 LAB — GLUCOSE TOLERANCE, 2 HOURS W/ 1HR
GLUCOSE, 1 HOUR: 124 mg/dL (ref 65–179)
Glucose, 2 hour: 71 mg/dL (ref 65–152)
Glucose, Fasting: 75 mg/dL (ref 65–91)

## 2017-04-22 LAB — RPR: RPR: NONREACTIVE

## 2017-05-05 ENCOUNTER — Encounter: Payer: Self-pay | Admitting: Medical

## 2017-05-05 ENCOUNTER — Ambulatory Visit (INDEPENDENT_AMBULATORY_CARE_PROVIDER_SITE_OTHER): Payer: Medicaid Other | Admitting: Medical

## 2017-05-05 VITALS — BP 124/71 | HR 82 | Wt 272.0 lb

## 2017-05-05 DIAGNOSIS — Z3480 Encounter for supervision of other normal pregnancy, unspecified trimester: Secondary | ICD-10-CM

## 2017-05-05 DIAGNOSIS — O99210 Obesity complicating pregnancy, unspecified trimester: Secondary | ICD-10-CM

## 2017-05-05 DIAGNOSIS — O0933 Supervision of pregnancy with insufficient antenatal care, third trimester: Secondary | ICD-10-CM

## 2017-05-05 NOTE — Progress Notes (Signed)
   PRENATAL VISIT NOTE  Subjective:  Shannon Clark is a 26 y.o. G2P0010 at 5141w4d being seen today for ongoing prenatal care.  She is currently monitored for the following issues for this low-risk pregnancy and has Supervision of other normal pregnancy, antepartum; Late prenatal care affecting pregnancy; and Obesity in pregnancy on their problem list.  Patient reports no complaints.  Contractions: Irritability. Vag. Bleeding: None.  Movement: Present. Denies leaking of fluid.   The following portions of the patient's history were reviewed and updated as appropriate: allergies, current medications, past family history, past medical history, past social history, past surgical history and problem list. Problem list updated.  Objective:   Vitals:   05/05/17 0753  BP: 124/71  Pulse: 82  Weight: 272 lb (123.4 kg)    Fetal Status: Fetal Heart Rate (bpm): 134 Fundal Height: 31 cm Movement: Present     General:  Alert, oriented and cooperative. Patient is in no acute distress.  Skin: Skin is warm and dry. No rash noted.   Cardiovascular: Normal heart rate noted  Respiratory: Normal respiratory effort, no problems with respiration noted  Abdomen: Soft, gravid, appropriate for gestational age.  Pain/Pressure: Absent     Pelvic: Cervical exam deferred        Extremities: Normal range of motion.  Edema: Mild pitting, slight indentation  Mental Status:  Normal mood and affect. Normal behavior. Normal judgment and thought content.   Assessment and Plan:  Pregnancy: G2P0010 at 6641w4d  1. Supervision of other normal pregnancy, antepartum - Doing well, few Deberah PeltonBraxton Hicks this weekend, none today   2. Obesity in pregnancy - Continued weight gain, just had Thanksgiving holiday, will continue to monitor  3. Late prenatal care affecting pregnancy in third trimester   Preterm labor symptoms and general obstetric precautions including but not limited to vaginal bleeding, contractions, leaking of  fluid and fetal movement were reviewed in detail with the patient. Please refer to After Visit Summary for other counseling recommendations.  Return in about 2 weeks (around 05/19/2017) for LOB.   Vonzella NippleJulie Wenzel, PA-C

## 2017-05-05 NOTE — Patient Instructions (Signed)
Fetal Movement Counts °Patient Name: ________________________________________________ Patient Due Date: ____________________ °What is a fetal movement count? °A fetal movement count is the number of times that you feel your baby move during a certain amount of time. This may also be called a fetal kick count. A fetal movement count is recommended for every pregnant woman. You may be asked to start counting fetal movements as early as week 28 of your pregnancy. °Pay attention to when your baby is most active. You may notice your baby's sleep and wake cycles. You may also notice things that make your baby move more. You should do a fetal movement count: °· When your baby is normally most active. °· At the same time each day. ° °A good time to count movements is while you are resting, after having something to eat and drink. °How do I count fetal movements? °1. Find a quiet, comfortable area. Sit, or lie down on your side. °2. Write down the date, the start time and stop time, and the number of movements that you felt between those two times. Take this information with you to your health care visits. °3. For 2 hours, count kicks, flutters, swishes, rolls, and jabs. You should feel at least 10 movements during 2 hours. °4. You may stop counting after you have felt 10 movements. °5. If you do not feel 10 movements in 2 hours, have something to eat and drink. Then, keep resting and counting for 1 hour. If you feel at least 4 movements during that hour, you may stop counting. °Contact a health care provider if: °· You feel fewer than 4 movements in 2 hours. °· Your baby is not moving like he or she usually does. °Date: ____________ Start time: ____________ Stop time: ____________ Movements: ____________ °Date: ____________ Start time: ____________ Stop time: ____________ Movements: ____________ °Date: ____________ Start time: ____________ Stop time: ____________ Movements: ____________ °Date: ____________ Start time:  ____________ Stop time: ____________ Movements: ____________ °Date: ____________ Start time: ____________ Stop time: ____________ Movements: ____________ °Date: ____________ Start time: ____________ Stop time: ____________ Movements: ____________ °Date: ____________ Start time: ____________ Stop time: ____________ Movements: ____________ °Date: ____________ Start time: ____________ Stop time: ____________ Movements: ____________ °Date: ____________ Start time: ____________ Stop time: ____________ Movements: ____________ °This information is not intended to replace advice given to you by your health care provider. Make sure you discuss any questions you have with your health care provider. °Document Released: 06/25/2006 Document Revised: 01/23/2016 Document Reviewed: 07/05/2015 °Elsevier Interactive Patient Education © 2018 [employer] °Braxton Hicks Contractions °Contractions of the uterus can occur throughout pregnancy, but they are not always a sign that you are in labor. You may have practice contractions called Braxton Hicks contractions. These false labor contractions are sometimes confused with true labor. °What are Braxton Hicks contractions? °Braxton Hicks contractions are tightening movements that occur in the muscles of the uterus before labor. Unlike true labor contractions, these contractions do not result in opening (dilation) and thinning of the cervix. Toward the end of pregnancy (32-34 weeks), Braxton Hicks contractions can happen more often and may become stronger. These contractions are sometimes difficult to tell apart from true labor because they can be very uncomfortable. You should not feel embarrassed if you go to the hospital with false labor. °Sometimes, the only way to tell if you are in true labor is for your health care provider to look for changes in the cervix. The health care provider will do a physical exam and may monitor your contractions. If   you are not in true labor, the exam  should show that your cervix is not dilating and your water has not broken. °If there are no prenatal problems or other health problems associated with your pregnancy, it is completely safe for you to be sent home with false labor. You may continue to have Braxton Hicks contractions until you go into true labor. °How can I tell the difference between true labor and false labor? °· Differences °? False labor °? Contractions last 30-70 seconds.: Contractions are usually shorter and not as strong as true labor contractions. °? Contractions become very regular.: Contractions are usually irregular. °? Discomfort is usually felt in the top of the uterus, and it spreads to the lower abdomen and low back.: Contractions are often felt in the front of the lower abdomen and in the groin. °? Contractions do not go away with walking.: Contractions may go away when you walk around or change positions while lying down. °? Contractions usually become more intense and increase in frequency.: Contractions get weaker and are shorter-lasting as time goes on. °? The cervix dilates and gets thinner.: The cervix usually does not dilate or become thin. °Follow these instructions at home: °· Take over-the-counter and prescription medicines only as told by your health care provider. °· Keep up with your usual exercises and follow other instructions from your health care provider. °· Eat and drink lightly if you think you are going into labor. °· If Braxton Hicks contractions are making you uncomfortable: °? Change your position from lying down or resting to walking, or change from walking to resting. °? Sit and rest in a tub of warm water. °? Drink enough fluid to keep your urine clear or pale yellow. Dehydration may cause these contractions. °? Do slow and deep breathing several times an hour. °· Keep all follow-up prenatal visits as told by your health care provider. This is important. °Contact a health care provider if: °· You have a  fever. °· You have continuous pain in your abdomen. °Get help right away if: °· Your contractions become stronger, more regular, and closer together. °· You have fluid leaking or gushing from your vagina. °· You pass blood-tinged mucus (bloody show). °· You have bleeding from your vagina. °· You have low back pain that you never had before. °· You feel your baby’s head pushing down and causing pelvic pressure. °· Your baby is not moving inside you as much as it used to. °Summary °· Contractions that occur before labor are called Braxton Hicks contractions, false labor, or practice contractions. °· Braxton Hicks contractions are usually shorter, weaker, farther apart, and less regular than true labor contractions. True labor contractions usually become progressively stronger and regular and they become more frequent. °· Manage discomfort from Braxton Hicks contractions by changing position, resting in a warm bath, drinking plenty of water, or practicing deep breathing. °This information is not intended to replace advice given to you by your health care provider. Make sure you discuss any questions you have with your health care provider. °Document Released: 05/26/2005 Document Revised: 04/14/2016 Document Reviewed: 04/14/2016 °Elsevier Interactive Patient Education © 2017 [employer] ° °

## 2017-05-07 ENCOUNTER — Ambulatory Visit (INDEPENDENT_AMBULATORY_CARE_PROVIDER_SITE_OTHER): Payer: Medicaid Other | Admitting: Otolaryngology

## 2017-05-07 DIAGNOSIS — H6122 Impacted cerumen, left ear: Secondary | ICD-10-CM

## 2017-05-18 ENCOUNTER — Telehealth: Payer: Self-pay | Admitting: *Deleted

## 2017-05-18 NOTE — Telephone Encounter (Signed)
Left voicemail message for patient.  Explained office would be closed tomorrow until 10 am.  Appt r/s to 05/21/17 at 1 pm.  Encouraged patient to call our office if this appointment isn't convenient and we will r/s.

## 2017-05-19 ENCOUNTER — Encounter: Payer: Medicaid Other | Admitting: Advanced Practice Midwife

## 2017-05-21 ENCOUNTER — Encounter: Payer: Medicaid Other | Admitting: Family Medicine

## 2017-06-03 ENCOUNTER — Ambulatory Visit (INDEPENDENT_AMBULATORY_CARE_PROVIDER_SITE_OTHER): Payer: Medicaid Other | Admitting: Family Medicine

## 2017-06-03 ENCOUNTER — Other Ambulatory Visit (HOSPITAL_COMMUNITY)
Admission: RE | Admit: 2017-06-03 | Discharge: 2017-06-03 | Disposition: A | Discharge status: Home / Self Care | Payer: Medicaid Other | Source: Ambulatory Visit | Attending: Family Medicine | Admitting: Family Medicine

## 2017-06-03 VITALS — BP 90/41 | HR 117 | Wt 279.3 lb

## 2017-06-03 DIAGNOSIS — Z3480 Encounter for supervision of other normal pregnancy, unspecified trimester: Secondary | ICD-10-CM

## 2017-06-03 DIAGNOSIS — Z3483 Encounter for supervision of other normal pregnancy, third trimester: Secondary | ICD-10-CM

## 2017-06-03 LAB — OB RESULTS CONSOLE GC/CHLAMYDIA: GC PROBE AMP, GENITAL: NEGATIVE

## 2017-06-03 LAB — OB RESULTS CONSOLE GBS: STREP GROUP B AG: NEGATIVE

## 2017-06-03 NOTE — Patient Instructions (Signed)
Braxton Hicks Contractions °Contractions of the uterus can occur throughout pregnancy, but they are not always a sign that you are in labor. You may have practice contractions called Braxton Hicks contractions. These false labor contractions are sometimes confused with true labor. °What are Braxton Hicks contractions? °Braxton Hicks contractions are tightening movements that occur in the muscles of the uterus before labor. Unlike true labor contractions, these contractions do not result in opening (dilation) and thinning of the cervix. Toward the end of pregnancy (32-34 weeks), Braxton Hicks contractions can happen more often and may become stronger. These contractions are sometimes difficult to tell apart from true labor because they can be very uncomfortable. You should not feel embarrassed if you go to the hospital with false labor. °Sometimes, the only way to tell if you are in true labor is for your health care provider to look for changes in the cervix. The health care provider will do a physical exam and may monitor your contractions. If you are not in true labor, the exam should show that your cervix is not dilating and your water has not broken. °If there are other health problems associated with your pregnancy, it is completely safe for you to be sent home with false labor. You may continue to have Braxton Hicks contractions until you go into true labor. °How to tell the difference between true labor and false labor °True labor °· Contractions last 30-70 seconds. °· Contractions become very regular. °· Discomfort is usually felt in the top of the uterus, and it spreads to the lower abdomen and low back. °· Contractions do not go away with walking. °· Contractions usually become more intense and increase in frequency. °· The cervix dilates and gets thinner. °False labor °· Contractions are usually shorter and not as strong as true labor contractions. °· Contractions are usually irregular. °· Contractions  are often felt in the front of the lower abdomen and in the groin. °· Contractions may go away when you walk around or change positions while lying down. °· Contractions get weaker and are shorter-lasting as time goes on. °· The cervix usually does not dilate or become thin. °Follow these instructions at home: °· Take over-the-counter and prescription medicines only as told by your health care provider. °· Keep up with your usual exercises and follow other instructions from your health care provider. °· Eat and drink lightly if you think you are going into labor. °· If Braxton Hicks contractions are making you uncomfortable: °? Change your position from lying down or resting to walking, or change from walking to resting. °? Sit and rest in a tub of warm water. °? Drink enough fluid to keep your urine pale yellow. Dehydration may cause these contractions. °? Do slow and deep breathing several times an hour. °· Keep all follow-up prenatal visits as told by your health care provider. This is important. °Contact a health care provider if: °· You have a fever. °· You have continuous pain in your abdomen. °Get help right away if: °· Your contractions become stronger, more regular, and closer together. °· You have fluid leaking or gushing from your vagina. °· You pass blood-tinged mucus (bloody show). °· You have bleeding from your vagina. °· You have low back pain that you never had before. °· You feel your baby’s head pushing down and causing pelvic pressure. °· Your baby is not moving inside you as much as it used to. °Summary °· Contractions that occur before labor are called Braxton   Hicks contractions, false labor, or practice contractions. °· Braxton Hicks contractions are usually shorter, weaker, farther apart, and less regular than true labor contractions. True labor contractions usually become progressively stronger and regular and they become more frequent. °· Manage discomfort from Braxton Hicks contractions by  changing position, resting in a warm bath, drinking plenty of water, or practicing deep breathing. °This information is not intended to replace advice given to you by your health care provider. Make sure you discuss any questions you have with your health care provider. °Document Released: 10/09/2016 Document Revised: 10/09/2016 Document Reviewed: 10/09/2016 °Elsevier Interactive Patient Education © 2018 [employer] ° °

## 2017-06-03 NOTE — Progress Notes (Signed)
   PRENATAL VISIT NOTE  Subjective:  Shannon Clark is a 26 y.o. G2P0010 at 7467w5d being seen today for ongoing prenatal care.  She is currently monitored for the following issues for this low-risk pregnancy and has Supervision of other normal pregnancy, antepartum; Late prenatal care affecting pregnancy; and Obesity in pregnancy on their problem list.  Patient reports no complaints.  Contractions: Not present. Vag. Bleeding: None.  Movement: Present. Denies leaking of fluid.   The following portions of the patient's history were reviewed and updated as appropriate: allergies, current medications, past family history, past medical history, past social history, past surgical history and problem list. Problem list updated.  Objective:   Vitals:   06/03/17 0803  BP: (!) 90/41  Pulse: (!) 117  Weight: 279 lb 4.8 oz (126.7 kg)    Fetal Status: Fetal Heart Rate (bpm): 153 Fundal Height: 35 cm Movement: Present  Presentation: Vertex  General:  Alert, oriented and cooperative. Patient is in no acute distress.  Skin: Skin is warm and dry. No rash noted.   Cardiovascular: Normal heart rate noted  Respiratory: Normal respiratory effort, no problems with respiration noted  Abdomen: Soft, gravid, appropriate for gestational age.  Pain/Pressure: Absent     Pelvic: Cervical exam deferred        Extremities: Normal range of motion.  Edema: Trace  Mental Status:  Normal mood and affect. Normal behavior. Normal judgment and thought content.   Assessment and Plan:  Pregnancy: G2P0010 at 6967w5d  1. Supervision of other normal pregnancy, antepartum - Culture, beta strep (group b only) - GC[facility]Chlamydia probe amp [facility]  Preterm labor symptoms and general obstetric precautions including but not limited to vaginal bleeding, contractions, leaking of fluid and fetal movement were reviewed in detail with the patient. Please refer to After Visit Summary for other counseling  recommendations.  Return in about 1 week (around 06/10/2017).   Rolm BookbinderAmber Klea Nall, DO

## 2017-06-04 LAB — GC/CHLAMYDIA PROBE AMP (~~LOC~~) NOT AT ARMC
Chlamydia: NEGATIVE
Neisseria Gonorrhea: NEGATIVE

## 2017-06-07 LAB — CULTURE, BETA STREP (GROUP B ONLY): Strep Gp B Culture: NEGATIVE

## 2017-06-09 NOTE — L&D Delivery Note (Signed)
Delivery Note At 7:28 AM a viable female was delivered via Vaginal, Spontaneous (Presentation: verted; ROA). Head delivered ROA. Nuchal cord, loose present x1. Shoulder and body delivered in usual fashion. Infant placed on mother's abdomen, dried and bulb suctioned. Cord clamped x 2 after 1-minute delay, and cut by family member. Cord blood drawn. After 1 minute, the cord was clamped and cut. 40 units of pitocin diluted in 1000cc LR was infused rapidly IV.  The placenta separated spontaneously and delivered via CCT and maternal pushing effort.  It was inspected and appears to be intact with a 3 VC.     APGAR: 8, 9; weight  .   Placenta status: intact, to L&D.  Cord: 3V with the following complications: .  Cord pH: not sent  Anesthesia: epidural  Episiotomy: None Lacerations:  2nd deg perineal Suture Repair: 3.0 monocryl Est. Blood Loss (mL):    Mom to postpartum.  Baby to Couplet care / Skin to Skin.  Alroy BailiffParker W Neziah Vogelgesang 07/11/2017, 8:03 AM

## 2017-06-10 ENCOUNTER — Ambulatory Visit (INDEPENDENT_AMBULATORY_CARE_PROVIDER_SITE_OTHER): Payer: Medicaid Other | Admitting: Student

## 2017-06-10 VITALS — BP 126/78 | HR 95 | Wt 279.0 lb

## 2017-06-10 DIAGNOSIS — Z3480 Encounter for supervision of other normal pregnancy, unspecified trimester: Secondary | ICD-10-CM

## 2017-06-10 NOTE — Progress Notes (Signed)
   PRENATAL VISIT NOTE  Subjective:  Shannon Clark is a 27 y.o. G2P0010 at 7027w5d being seen today for ongoing prenatal care.  She is currently monitored for the following issues for this low-risk pregnancy and has Supervision of other normal pregnancy, antepartum; Late prenatal care affecting pregnancy; and Obesity in pregnancy on their problem list.  Patient reports no complaints.  Contractions: Irritability. Vag. Bleeding: None.  Movement: Present. Denies leaking of fluid.   The following portions of the patient's history were reviewed and updated as appropriate: allergies, current medications, past family history, past medical history, past social history, past surgical history and problem list. Problem list updated.  Objective:   Vitals:   06/10/17 1025  BP: 126/78  Pulse: 95  Weight: 279 lb (126.6 kg)    Fetal Status: Fetal Heart Rate (bpm): 152 Fundal Height: 36 cm Movement: Present     General:  Alert, oriented and cooperative. Patient is in no acute distress.  Skin: Skin is warm and dry. No rash noted.   Cardiovascular: Normal heart rate noted  Respiratory: Normal respiratory effort, no problems with respiration noted  Abdomen: Soft, gravid, appropriate for gestational age.  Pain/Pressure: Present     Pelvic: Cervical exam deferred        Extremities: Normal range of motion.  Edema: Trace  Mental Status:  Normal mood and affect. Normal behavior. Normal judgment and thought content.   Assessment and Plan:  Pregnancy: G2P0010 at 1927w5d  1. Supervision of other normal pregnancy, antepartum -doing well  Term labor symptoms and general obstetric precautions including but not limited to vaginal bleeding, contractions, leaking of fluid and fetal movement were reviewed in detail with the patient. Please refer to After Visit Summary for other counseling recommendations.  Return in about 1 week (around 06/17/2017) for Routine OB.   Judeth HornErin Flower Franko, NP

## 2017-06-10 NOTE — Patient Instructions (Addendum)

## 2017-06-10 NOTE — Progress Notes (Signed)
Pt stated having lower abdominal pain when long distance..Marland Kitchen

## 2017-06-17 ENCOUNTER — Ambulatory Visit: Payer: Medicaid Other | Admitting: Student

## 2017-06-17 VITALS — BP 109/73 | HR 118 | Wt 282.5 lb

## 2017-06-17 DIAGNOSIS — Z3480 Encounter for supervision of other normal pregnancy, unspecified trimester: Secondary | ICD-10-CM

## 2017-06-17 DIAGNOSIS — Z029 Encounter for administrative examinations, unspecified: Secondary | ICD-10-CM

## 2017-06-17 NOTE — Patient Instructions (Addendum)
Before [facility] Before your baby arrives it is important to:  Have all of the supplies that you will need to care for your baby.  Know where to go if there is an emergency.  Discuss the baby's arrival with other family members.  What supplies will I need?  It is recommended that you have the following supplies: Large Items  Crib.  Crib mattress.  Rear-facing infant car seat. If possible, have a trained professional check to make sure that it is installed correctly.  Feeding  6-8 bottles that are 4-5 oz in size.  6-8 nipples.  Bottle brush.  Sterilizer, or a large pan or kettle with a lid.  A way to boil and cool water.  If you will be breastfeeding: ? Breast pump. ? Nipple cream. ? Nursing bra. ? Breast pads. ? Breast shields.  If you will be formula feeding: ? Formula. ? Measuring cups. ? Measuring spoons.  Bathing  Mild baby soap and baby shampoo.  Petroleum jelly.  Soft cloth towel and washcloth.  Hooded towel.  Cotton balls.  Bath basin.  Other Supplies  Rectal thermometer.  Bulb syringe.  Baby wipes or washcloths for diaper changes.  Diaper bag.  Changing pad.  Clothing, including one-piece outfits and pajamas.  Baby nail clippers.  Receiving blankets.  Mattress pad and sheets for the crib.  Night-light for the baby's room.  Baby monitor.  2 or 3 pacifiers.  Either 24-36 cloth diapers and waterproof diaper covers or a box of disposable diapers. You may need to use as many as 10-12 diapers per day.  How do I prepare for an emergency? Prepare for an emergency by:  Knowing how to get to the nearest hospital.  Listing the phone numbers of your baby's health care providers near your home phone and in your cell phone.  How do I prepare my family?  Decide how to handle visitors.  If you have other children: ? Talk with them about the baby coming home. Ask them how they feel about it. ? Read a book together about  being a new big brother or sister. ? Find ways to let them help you prepare for the new baby. ? Have someone ready to care for them while you are in the hospital. This information is not intended to replace advice given to you by your health care provider. Make sure you discuss any questions you have with your health care provider. Document Released: 05/08/2008 Document Revised: 11/01/2015 Document Reviewed: 05/03/2014 Elsevier Interactive Patient Education  2018 [employer].       Contraception Choices Contraception, also called birth control, refers to methods or devices that prevent pregnancy. Hormonal methods Contraceptive implant A contraceptive implant is a thin, plastic tube that contains a hormone. It is inserted into the upper part of the arm. It can remain in place for up to 3 years. Progestin-only injections Progestin-only injections are injections of progestin, a synthetic form of the hormone progesterone. They are given every 3 months by a health care provider. Birth control pills Birth control pills are pills that contain hormones that prevent pregnancy. They must be taken once a day, preferably at the same time each day. Birth control patch The birth control patch contains hormones that prevent pregnancy. It is placed on the skin and must be changed once a week for three weeks and removed on the fourth week. A prescription is needed to use this method of contraception. Vaginal ring A vaginal ring contains hormones  that prevent pregnancy. It is placed in the vagina for three weeks and removed on the fourth week. After that, the process is repeated with a new ring. A prescription is needed to use this method of contraception. Emergency contraceptive Emergency contraceptives prevent pregnancy after unprotected sex. They come in pill form and can be taken up to 5 days after sex. They work best the sooner they are taken after having sex. Most emergency contraceptives are  available without a prescription. This method should not be used as your only form of birth control. Barrier methods Female condom A female condom is a thin sheath that is worn over the penis during sex. Condoms keep sperm from going inside a woman's body. They can be used with a spermicide to increase their effectiveness. They should be disposed after a single use. Female condom A female condom is a soft, loose-fitting sheath that is put into the vagina before sex. The condom keeps sperm from going inside a woman's body. They should be disposed after a single use. Diaphragm A diaphragm is a soft, dome-shaped barrier. It is inserted into the vagina before sex, along with a spermicide. The diaphragm blocks sperm from entering the uterus, and the spermicide kills sperm. A diaphragm should be left in the vagina for 6-8 hours after sex and removed within 24 hours. A diaphragm is prescribed and fitted by a health care provider. A diaphragm should be replaced every 1-2 years, after giving birth, after gaining more than 15 lb (6.8 kg), and after pelvic surgery. Cervical cap A cervical cap is a round, soft latex or plastic cup that fits over the cervix. It is inserted into the vagina before sex, along with spermicide. It blocks sperm from entering the uterus. The cap should be left in place for 6-8 hours after sex and removed within 48 hours. A cervical cap must be prescribed and fitted by a health care provider. It should be replaced every 2 years. Sponge A sponge is a soft, circular piece of polyurethane foam with spermicide on it. The sponge helps block sperm from entering the uterus, and the spermicide kills sperm. To use it, you make it wet and then insert it into the vagina. It should be inserted before sex, left in for at least 6 hours after sex, and removed and thrown away within 30 hours. Spermicides Spermicides are chemicals that kill or block sperm from entering the cervix and uterus. They can come as a  cream, jelly, suppository, foam, or tablet. A spermicide should be inserted into the vagina with an applicator at least 10-15 minutes before sex to allow time for it to work. The process must be repeated every time you have sex. Spermicides do not require a prescription. Intrauterine contraception Intrauterine device (IUD) An IUD is a T-shaped device that is put in a woman's uterus. There are two types:  Hormone IUD.This type contains progestin, a synthetic form of the hormone progesterone. This type can stay in place for 3-5 years.  Copper IUD.This type is wrapped in copper wire. It can stay in place for 10 years.  Permanent methods of contraception Female tubal ligation In this method, a woman's fallopian tubes are sealed, tied, or blocked during surgery to prevent eggs from traveling to the uterus. Hysteroscopic sterilization In this method, a small, flexible insert is placed into each fallopian tube. The inserts cause scar tissue to form in the fallopian tubes and block them, so sperm cannot reach an egg. The procedure takes about 3  months to be effective. Another form of birth control must be used during those 3 months. Female sterilization This is a procedure to tie off the tubes that carry sperm (vasectomy). After the procedure, the man can still ejaculate fluid (semen). Natural planning methods Natural family planning In this method, a couple does not have sex on days when the woman could become pregnant. Calendar method This means keeping track of the length of each menstrual cycle, identifying the days when pregnancy can happen, and not having sex on those days. Ovulation method In this method, a couple avoids sex during ovulation. Symptothermal method This method involves not having sex during ovulation. The woman typically checks for ovulation by watching changes in her temperature and in the consistency of cervical mucus. Post-ovulation method In this method, a couple waits to  have sex until after ovulation. Summary  Contraception, also called birth control, means methods or devices that prevent pregnancy.  Hormonal methods of contraception include implants, injections, pills, patches, vaginal rings, and emergency contraceptives.  Barrier methods of contraception can include female condoms, female condoms, diaphragms, cervical caps, sponges, and spermicides.  There are two types of IUDs (intrauterine devices). An IUD can be put in a woman's uterus to prevent pregnancy for 3-5 years.  Permanent sterilization can be done through a procedure for males, females, or both.  Natural family planning methods involve not having sex on days when the woman could become pregnant. This information is not intended to replace advice given to you by your health care provider. Make sure you discuss any questions you have with your health care provider. Document Released: 05/26/2005 Document Revised: 06/28/2016 Document Reviewed: 06/28/2016 Elsevier Interactive Patient Education  2018 [employer]

## 2017-06-17 NOTE — Progress Notes (Signed)
   PRENATAL VISIT NOTE  Subjective:  Shannon Clark is a 27 y.o. G2P0010 at 5538w5d being seen today for ongoing prenatal care.  She is currently monitored for the following issues for this low-risk pregnancy and has Supervision of other normal pregnancy, antepartum; Late prenatal care affecting pregnancy; and Obesity in pregnancy on their problem list.  Patient reports no complaints.  Contractions: Irritability. Vag. Bleeding: None.  Movement: Present. Denies leaking of fluid.   The following portions of the patient's history were reviewed and updated as appropriate: allergies, current medications, past family history, past medical history, past social history, past surgical history and problem list. Problem list updated.  Objective:   Vitals:   06/17/17 0925  BP: 109/73  Pulse: (!) 118  Weight: 282 lb 8 oz (128.1 kg)    Fetal Status: Fetal Heart Rate (bpm): 145 Fundal Height: 37 cm Movement: Present  Presentation: Vertex  General:  Alert, oriented and cooperative. Patient is in no acute distress.  Skin: Skin is warm and dry. No rash noted.   Cardiovascular: Normal heart rate noted  Respiratory: Normal respiratory effort, no problems with respiration noted  Abdomen: Soft, gravid, appropriate for gestational age.  Pain/Pressure: Present     Pelvic: Cervical exam performed Dilation: Fingertip Effacement (%): 50 Station: -3  Extremities: Normal range of motion.  Edema: None  Mental Status:  Normal mood and affect. Normal behavior. Normal judgment and thought content.   Assessment and Plan:  Pregnancy: G2P0010 at 4338w5d  1. Supervision of other normal pregnancy, antepartum -doing well  Term labor symptoms and general obstetric precautions including but not limited to vaginal bleeding, contractions, leaking of fluid and fetal movement were reviewed in detail with the patient. Please refer to After Visit Summary for other counseling recommendations.  Return in about 1 week (around  06/24/2017) for Routine OB.   Judeth HornErin Jabre Heo, NP

## 2017-06-24 ENCOUNTER — Encounter: Payer: Self-pay | Admitting: Medical

## 2017-06-24 ENCOUNTER — Ambulatory Visit (INDEPENDENT_AMBULATORY_CARE_PROVIDER_SITE_OTHER): Payer: Medicaid Other | Admitting: Medical

## 2017-06-24 VITALS — BP 108/68 | HR 86 | Wt 281.3 lb

## 2017-06-24 DIAGNOSIS — Z3480 Encounter for supervision of other normal pregnancy, unspecified trimester: Secondary | ICD-10-CM

## 2017-06-24 NOTE — Progress Notes (Signed)
   PRENATAL VISIT NOTE  Subjective:  Shannon Clark is a 27 y.o. G2P0010 at 2732w5d being seen today for ongoing prenatal care.  She is currently monitored for the following issues for this low-risk pregnancy and has Supervision of other normal pregnancy, antepartum; Late prenatal care affecting pregnancy; and Obesity in pregnancy on their problem list.  Patient reports occasional contractions.  Contractions: Irregular. Vag. Bleeding: None.  Movement: Present. Denies leaking of fluid.   The following portions of the patient's history were reviewed and updated as appropriate: allergies, current medications, past family history, past medical history, past social history, past surgical history and problem list. Problem list updated.  Objective:   Vitals:   06/24/17 1426  BP: 108/68  Pulse: 86  Weight: 281 lb 4.8 oz (127.6 kg)    Fetal Status: Fetal Heart Rate (bpm): 153 Fundal Height: 38 cm Movement: Present  Presentation: Vertex  General:  Alert, oriented and cooperative. Patient is in no acute distress.  Skin: Skin is warm and dry. No rash noted.   Cardiovascular: Normal heart rate noted  Respiratory: Normal respiratory effort, no problems with respiration noted  Abdomen: Soft, gravid, appropriate for gestational age.  Pain/Pressure: Present     Pelvic: Cervical exam performed Dilation: Fingertip Effacement (%): 50 Station: -3  Extremities: Normal range of motion.  Edema: Trace  Mental Status:  Normal mood and affect. Normal behavior. Normal judgment and thought content.   Assessment and Plan:  Pregnancy: G2P0010 at 2732w5d  1. Supervision of other normal pregnancy, antepartum - Doing well - GBS negative discussed  Term labor symptoms and general obstetric precautions including but not limited to vaginal bleeding, contractions, leaking of fluid and fetal movement were reviewed in detail with the patient. Please refer to After Visit Summary for other counseling recommendations.    Return in about 1 week (around 07/01/2017) for LOB.   Vonzella NippleJulie Wenzel, PA-C

## 2017-06-24 NOTE — Patient Instructions (Signed)

## 2017-07-02 ENCOUNTER — Ambulatory Visit (INDEPENDENT_AMBULATORY_CARE_PROVIDER_SITE_OTHER): Payer: Medicaid Other | Admitting: Student

## 2017-07-02 ENCOUNTER — Other Ambulatory Visit: Payer: Self-pay | Admitting: Student

## 2017-07-02 ENCOUNTER — Telehealth (HOSPITAL_COMMUNITY): Payer: Self-pay | Admitting: *Deleted

## 2017-07-02 DIAGNOSIS — Z3480 Encounter for supervision of other normal pregnancy, unspecified trimester: Secondary | ICD-10-CM

## 2017-07-02 DIAGNOSIS — Z3483 Encounter for supervision of other normal pregnancy, third trimester: Secondary | ICD-10-CM

## 2017-07-02 NOTE — Telephone Encounter (Signed)
Preadmission screen  

## 2017-07-02 NOTE — Patient Instructions (Signed)

## 2017-07-02 NOTE — Progress Notes (Signed)
   PRENATAL VISIT NOTE  Subjective:  Alain HoneyDaniele Mogg is a 27 y.o. G2P0010 at 313w6d being seen today for ongoing prenatal care.  She is currently monitored for the following issues for this low-risk pregnancy and has Supervision of other normal pregnancy, antepartum; Late prenatal care affecting pregnancy; and Obesity in pregnancy on their problem list.  Patient reports no significant complaints but notes mild swelling in her feet as well as some pelvic and back pain consistent with round  ligamment pain.  .  Contractions: Irritability. Vag. Bleeding: None.  Movement: Present. Denies leaking of fluid.   The following portions of the patient's history were reviewed and updated as appropriate: allergies, current medications, past family history, past medical history, past social history, past surgical history and problem list. Problem list updated.  Objective:   Vitals:   07/02/17 0922  BP: 132/74  Pulse: 92  Weight: 286 lb (129.7 kg)    Fetal Status: Fetal Heart Rate (bpm): 146   Movement: Present    fundal height: 38cm  General:  Alert, oriented and cooperative. Patient is in no acute distress.  Skin: Skin is warm and dry. No rash noted.   Cardiovascular: Normal heart rate noted  Respiratory: Normal respiratory effort, no problems with respiration noted  Abdomen: Soft, gravid, appropriate for gestational age.  Pain/Pressure: Absent     Pelvic: Cervical exam deferred        Extremities: Normal range of motion.  Edema: Trace  Mental Status:  Normal mood and affect. Normal behavior. Normal judgment and thought content.   Assessment and Plan:  Pregnancy: G2P0010 at 7313w6d  1. Supervision of other normal pregnancy, antepartum No significant complaints at this time.   -Discussed IUD.   Term labor symptoms and general obstetric precautions including but not limited to vaginal bleeding, contractions, leaking of fluid and fetal movement were reviewed in detail with the patient. Please  refer to After Visit Summary for other counseling recommendations.  Return in about 1 week (around 07/09/2017), or LROB and BPP.   Ames Coupeharles A McLendon, Medical Student   Plan for induction at 40 weeks; orders placed.    I confirm that I have verified the information documented in the medical student's note and that I have also personally reperformed the physical exam and all medical decision making activities.  Luna KitchensKAthryn Ann Bohne

## 2017-07-06 ENCOUNTER — Ambulatory Visit: Payer: Self-pay

## 2017-07-06 ENCOUNTER — Ambulatory Visit (INDEPENDENT_AMBULATORY_CARE_PROVIDER_SITE_OTHER): Payer: Medicaid Other | Admitting: Obstetrics & Gynecology

## 2017-07-06 ENCOUNTER — Ambulatory Visit (INDEPENDENT_AMBULATORY_CARE_PROVIDER_SITE_OTHER): Payer: Medicaid Other | Admitting: *Deleted

## 2017-07-06 VITALS — BP 121/73 | HR 110 | Wt 288.8 lb

## 2017-07-06 DIAGNOSIS — O480 Post-term pregnancy: Secondary | ICD-10-CM

## 2017-07-06 DIAGNOSIS — Z3480 Encounter for supervision of other normal pregnancy, unspecified trimester: Secondary | ICD-10-CM

## 2017-07-06 DIAGNOSIS — Z3483 Encounter for supervision of other normal pregnancy, third trimester: Secondary | ICD-10-CM

## 2017-07-06 NOTE — Progress Notes (Signed)
IOL scheduled on 2/1

## 2017-07-06 NOTE — Progress Notes (Signed)
   PRENATAL VISIT NOTE  Subjective:  Shannon Clark is a 27 y.o. G2P0010 at 4766w3d being seen today for ongoing prenatal care.  She is currently monitored for the following issues for this low-risk pregnancy and has Supervision of other normal pregnancy, antepartum; Late prenatal care affecting pregnancy; and Obesity in pregnancy on their problem list.  Patient reports no complaints.  Contractions: Not present. Vag. Bleeding: None.  Movement: Present. Denies leaking of fluid.   The following portions of the patient's history were reviewed and updated as appropriate: allergies, current medications, past family history, past medical history, past social history, past surgical history and problem list. Problem list updated.  Objective:   Vitals:   07/06/17 0829  BP: 121/73  Pulse: (!) 110  Weight: 288 lb 12.8 oz (131 kg)    Fetal Status: Fetal Heart Rate (bpm): NST   Movement: Present     General:  Alert, oriented and cooperative. Patient is in no acute distress.  Skin: Skin is warm and dry. No rash noted.   Cardiovascular: Normal heart rate noted  Respiratory: Normal respiratory effort, no problems with respiration noted  Abdomen: Soft, gravid, appropriate for gestational age.  Pain/Pressure: Present     Pelvic: Cervical exam deferred        Extremities: Normal range of motion.     Mental Status:  Normal mood and affect. Normal behavior. Normal judgment and thought content.   Assessment and Plan:  Pregnancy: G2P0010 at 2566w3d  1. Supervision of other normal pregnancy, antepartum IOL at 41 weeks if not delivered  2. Post term pregnancy, antepartum condition or complication   Term labor symptoms and general obstetric precautions including but not limited to vaginal bleeding, contractions, leaking of fluid and fetal movement were reviewed in detail with the patient. Please refer to After Visit Summary for other counseling recommendations.  Return in about 5 weeks (around  08/10/2017) for PP visit.  IOL on 2/1.   Scheryl DarterJames Lauretta Sallas, MD

## 2017-07-06 NOTE — Patient Instructions (Signed)
Augmentation of Labor Augmentation of labor is when steps are taken to stimulate and strengthen uterine contractions during labor. This may be done when the contractions have slowed down or stopped, delaying progress of labor and delivery of the baby. Before beginning augmentation of labor, the health care provider will evaluate the condition of the mother and baby, the size and position of the baby, and the size of the birth canal. What are the reasons for labor augmentation? Reasons for augmentation of labor include:  Slow labor (prolonged first and second stage of labor) that has been associated with increased maternal risks, such as chorioamnionitis, postpartum hemorrhage, operative vaginal delivery, or third-degree or fourth-degree perineal lacerations.  Decreased average length of labor.  What methods are used for labor augmentation? Various methods may be used for augmentation of labor, including:  Oxytocin medicine. This medicine stimulates contractions. It is given through an IV access tube inserted into a vein.  Breaking the fluid-filled sac that surrounds the fetus (amniotic sac).  Stripping the membranes. The health care provider separates amniotic sac tissue from the cervix, causing the release of a hormone called progesterone that can stimulate uterine contractions.  Nipple stimulation.  Stimulation of certain pressure points on the ankles.  Manual or mechanical dilation of the cervix.  What are the risks associated with labor augmentation?  Overstimulation of the uterine contractions (continuous, prolonged, very strong contractions), causing fetal distress.  Increased chance of infection for the mother and baby.  Uterine tearing (rupture).  Breaking off (abruption) of the placenta.  Increased chance of cesarean, forceps, or vacuum delivery. What are some reasons for not doing labor augmentation? Augmentation of labor should not be done if:  The baby is too big for  the birth canal. This can be confirmed by ultrasonography.  The umbilical cord drops in front of the baby's head or breech part (prolapsed cord).  The mother had a previous cesarean delivery with a vertical incision in the uterus (or the kind of incision used is not known). High dose oxytocin should not be used if the mother had a previous cesarean delivery of any kind.  The mother had previous surgery on or into the uterus.  The mother has herpes.  The mother has cervical cancer.  The baby is lying sideways.  The mother's pelvis is deformed.  The mother is pregnant with more than two babies.  This information is not intended to replace advice given to you by your health care provider. Make sure you discuss any questions you have with your health care provider. Document Released: 11/18/2006 Document Revised: 11/07/2015 Document Reviewed: 12/23/2012 Elsevier Interactive Patient Education  2017 [employer] Inc.  

## 2017-07-06 NOTE — Progress Notes (Signed)

## 2017-07-07 ENCOUNTER — Encounter: Payer: Self-pay | Admitting: *Deleted

## 2017-07-10 ENCOUNTER — Encounter (HOSPITAL_COMMUNITY): Payer: Self-pay

## 2017-07-10 ENCOUNTER — Inpatient Hospital Stay (HOSPITAL_COMMUNITY)
Admission: RE | Admit: 2017-07-10 | Discharge: 2017-07-13 | DRG: 807 | Disposition: A | Discharge status: Home / Self Care | Payer: Medicaid Other | Source: Ambulatory Visit | Attending: Obstetrics and Gynecology | Admitting: Obstetrics and Gynecology

## 2017-07-10 DIAGNOSIS — E669 Obesity, unspecified: Secondary | ICD-10-CM | POA: Diagnosis present

## 2017-07-10 DIAGNOSIS — O480 Post-term pregnancy: Secondary | ICD-10-CM | POA: Diagnosis present

## 2017-07-10 DIAGNOSIS — O99214 Obesity complicating childbirth: Secondary | ICD-10-CM | POA: Diagnosis present

## 2017-07-10 DIAGNOSIS — Z3A41 41 weeks gestation of pregnancy: Secondary | ICD-10-CM | POA: Diagnosis not present

## 2017-07-10 DIAGNOSIS — Z87891 Personal history of nicotine dependence: Secondary | ICD-10-CM

## 2017-07-10 DIAGNOSIS — Z3480 Encounter for supervision of other normal pregnancy, unspecified trimester: Secondary | ICD-10-CM

## 2017-07-10 LAB — RPR: RPR: NONREACTIVE

## 2017-07-10 LAB — TYPE AND SCREEN
ABO/RH(D): A POS
Antibody Screen: NEGATIVE

## 2017-07-10 LAB — CBC
HEMATOCRIT: 37.4 % (ref 36.0–46.0)
HEMOGLOBIN: 12.8 g/dL (ref 12.0–15.0)
MCH: 30.3 pg (ref 26.0–34.0)
MCHC: 34.2 g/dL (ref 30.0–36.0)
MCV: 88.6 fL (ref 78.0–100.0)
Platelets: 182 10*3/uL (ref 150–400)
RBC: 4.22 MIL/uL (ref 3.87–5.11)
RDW: 13.3 % (ref 11.5–15.5)
WBC: 10.8 10*3/uL — ABNORMAL HIGH (ref 4.0–10.5)

## 2017-07-10 LAB — ABO/RH: ABO/RH(D): A POS

## 2017-07-10 MED ORDER — SOD CITRATE-CITRIC ACID 500-334 MG/5ML PO SOLN: 30.0000 mL | ORAL | Status: DC | PRN

## 2017-07-10 MED ORDER — FENTANYL CITRATE (PF) 100 MCG/2ML IJ SOLN
100.0000 ug | INTRAMUSCULAR | Status: DC | PRN
  Administered 2017-07-10 – 2017-07-11 (×2): 100 ug via INTRAVENOUS
  Filled 2017-07-10 (×2): qty 2

## 2017-07-10 MED ORDER — LACTATED RINGERS IV SOLN
INTRAVENOUS | Status: DC
  Administered 2017-07-11 (×2): via INTRAVENOUS

## 2017-07-10 MED ORDER — OXYCODONE-ACETAMINOPHEN 5-325 MG PO TABS: 2.0000 | ORAL_TABLET | ORAL | Status: DC | PRN

## 2017-07-10 MED ORDER — OXYTOCIN 40 UNITS IN LACTATED RINGERS INFUSION - SIMPLE MED: 2.5000 [IU]/h | INTRAVENOUS | Status: DC

## 2017-07-10 MED ORDER — TERBUTALINE SULFATE 1 MG/ML IJ SOLN
0.2500 mg | Freq: Once | INTRAMUSCULAR | Status: DC | PRN
  Filled 2017-07-10: qty 1

## 2017-07-10 MED ORDER — LACTATED RINGERS IV SOLN
500.0000 mL | INTRAVENOUS | Status: DC | PRN
  Administered 2017-07-11: 500 mL via INTRAVENOUS

## 2017-07-10 MED ORDER — MISOPROSTOL 25 MCG QUARTER TABLET
25.0000 ug | ORAL_TABLET | ORAL | Status: DC | PRN
  Administered 2017-07-10 (×2): 25 ug via VAGINAL
  Filled 2017-07-10 (×3): qty 1

## 2017-07-10 MED ORDER — OXYTOCIN BOLUS FROM INFUSION
500.0000 mL | Freq: Once | INTRAVENOUS | Status: AC
  Administered 2017-07-11: 500 mL via INTRAVENOUS

## 2017-07-10 MED ORDER — LIDOCAINE HCL (PF) 1 % IJ SOLN
30.0000 mL | INTRAMUSCULAR | Status: DC | PRN
  Filled 2017-07-10: qty 30

## 2017-07-10 MED ORDER — MISOPROSTOL 50MCG HALF TABLET
50.0000 ug | ORAL_TABLET | ORAL | Status: DC
  Administered 2017-07-10: 50 ug via ORAL
  Filled 2017-07-10 (×2): qty 1

## 2017-07-10 MED ORDER — OXYTOCIN 40 UNITS IN LACTATED RINGERS INFUSION - SIMPLE MED
1.0000 m[IU]/min | INTRAVENOUS | Status: DC
  Administered 2017-07-11: 2 m[IU]/min via INTRAVENOUS
  Filled 2017-07-10: qty 1000

## 2017-07-10 MED ORDER — ONDANSETRON HCL 4 MG/2ML IJ SOLN
4.0000 mg | Freq: Four times a day (QID) | INTRAMUSCULAR | Status: DC | PRN
  Administered 2017-07-10: 4 mg via INTRAVENOUS
  Filled 2017-07-10 (×2): qty 2

## 2017-07-10 MED ORDER — ACETAMINOPHEN 325 MG PO TABS
650.0000 mg | ORAL_TABLET | ORAL | Status: DC | PRN
  Administered 2017-07-11: 650 mg via ORAL
  Filled 2017-07-10 (×2): qty 2

## 2017-07-10 MED ORDER — OXYCODONE-ACETAMINOPHEN 5-325 MG PO TABS: 1.0000 | ORAL_TABLET | ORAL | Status: DC | PRN

## 2017-07-10 NOTE — Anesthesia Pain Management Evaluation Note (Signed)
  [facility] Pain Management Visit Note  Patient: Shannon Clark, 27 y.o., female  "Hello I am a [facility] of the anesthesia [facility] at [facility]. We have an anesthesia team available at all times to provide care throughout the hospital, including epidural management and anesthesia for C-section. I don't know your plan for the delivery whether it a natural birth, water birth, IV sedation, nitrous supplementation, doula or epidural, but we want to meet your pain goals."   1.Was your pain managed to your expectations on prior hospitalizations?   No prior hospitalizations  2.What is your expectation for pain management during this hospitalization?     Epidural  3.How can we help you reach that goal? Be avaible  Record the patient's initial score and the patient's pain goal.   Pain: 0  Pain Goal: 5 [facility] wants you to be able to say your pain was always managed very well.  [facility]ial[facility]ERRITT,Shannon Pinkerton 07/10/2017

## 2017-07-10 NOTE — Progress Notes (Signed)
Patient ID: Shannon Clark, female   DOB: 12/06/1990, 27 y.o.   MRN: [id number]  Foley came out recently at approx 1945; cyto vag x 2 doses today  BP 129/75, other VSS FHR 130s, +accels, occ variables, +LTV Ctx irreg 2-6 mins Cx post 4/50/-3/vtx, +bldy show  IUP@term  Mostly favorable cx GBS neg  Will give one more dose of cytotec (50 buccally), then start Pit in 4hr Anticipate SVD  Cam HaiSHAW, Amaris Delafuente CNM 07/10/2017 8:50 PM

## 2017-07-10 NOTE — H&P (Signed)
OBSTETRIC ADMISSION HISTORY AND PHYSICAL  Shannon Clark is a 27 y.o. female G2P0010 with IUP at [redacted]w[redacted]d by LMP presenting for IOL for postdates . She reports +FMs, No LOF, no VB, no blurry vision, headaches or peripheral edema, and RUQ pain.  She plans on breast feeding. She request IUD for birth control. She received her prenatal care at [facility]   Dating: By LMP --->  Estimated Date of Delivery: 07/03/17  Sono: 03/24/17   @[redacted]w[redacted]d ,, normal anatomy, cephalic presentation, anterior placenta , 838g, 55% EFW  Clinic  [facility]   Prenatal Labs  Dating  LMP  Blood type: A/Positive/-- (09/12 1402) A pos  Genetic Screen 1 Screen:  declined  Antibody:Negative (09/12 1402)neg  Anatomic [place]  normal 9/18 w rec to rescan for incomplete> fup nrmal Rubella:  imm  GTT Early:               Third trimester: 75-124-71 RPR: Non Reactive (09/12 1402) neg  Flu vaccine   Declines HBsAg: Negative (09/12 1402) neg  TDaP vaccine   04/06/17                                     Rhogam: n/a HIV:   neg  Baby Food    breast                         GBS:  negative  Contraception  considering IUD Pap: neg 2018  Circumcision  female fetus   Pediatrician  gave list 10/3 CF: neg 32  Support Person  Storm SMA: low risk  Prenatal Classes  Discussed Hgb electrophoresis: neg   Prenatal History/Complications: Late prenatal care Obesity  Past Medical History: Obesity  Past Surgical History: History reviewed. No pertinent surgical history.  Obstetrical History: OB History    Gravida Para Term Preterm AB Living   2       1     SAB TAB Ectopic Multiple Live Births   1              Social History: Social History   Socioeconomic History  . Marital status: Single    Spouse name: None  . Number of children: None  . Years of education: None  . Highest education level: None  Social Needs  . Financial resource strain: None  . Food insecurity - worry: None  . Food insecurity - inability: None  . Transportation needs -  medical: None  . Transportation needs - non-medical: None  Occupational History  . None  Tobacco Use  . Smoking status: Former Games developer  . Smokeless tobacco: Never Used  . Tobacco comment: very little in college  Substance and Sexual Activity  . Alcohol use: No  . Drug use: No  . Sexual activity: Yes    Birth control/protection: None  Other Topics Concern  . None  Social History Narrative  . None    Family History: History reviewed. No pertinent family history.  Allergies: No Known Allergies  Medications Prior to Admission  Medication Sig Dispense Refill Last Dose  . Prenatal Multivit-Min-Fe-FA (PRENATAL VITAMINS PO) Take 1 tablet by mouth daily.   Taking     Review of Systems   All systems reviewed and negative except as stated in HPI  Resp. rate 18, height 6' (1.829 m), weight 293 lb (132.9 kg), last menstrual period 09/26/2016. General appearance: alert, cooperative and appears stated  age Lungs: clear to auscultation bilaterally Heart: regular rate and rhythm Abdomen: soft, non-tender; bowel sounds normal Pelvic: 2 cm /50/-2 Extremities: Homans sign is negative, no sign of DVT DTR's intact  Presentation: cephalic Fetal monitoringBaseline: 145 bpm Uterine activity irregular      Prenatal labs: ABO, Rh: A/Positive/-- (09/12 1402) Antibody: Negative (09/12 1402) Rubella: 2.41 (09/12 1402) RPR: Non Reactive (11/13 1005)  HBsAg: Negative (09/12 1402)  HIV: Non Reactive (11/13 1005)  GBS: Negative (12/26 0000)  1 hr Glucola third trimester normal  Genetic screening  Declined  Anatomy US normal  Prenatal Transfer Tool  Maternal Diabetes: No Genetic Screening: Declined Maternal Ultrasounds/Referrals: Normal Fetal Ultrasounds or other Referrals:  None Maternal Substance Abuse:  No Significant Maternal Medications:  None Significant Maternal Lab Results: Lab values include: Group B Strep negative  Results for orders placed or performed during the hospital  encounter of 07/10/17 (from the past 24 hour(s))  CBC   Collection Time: 07/10/17  8:30 AM  Result Value Ref Range   WBC 10.8 (H) 4.0 - 10.5 K/uL   RBC 4.22 3.87 - 5.11 MIL/uL   Hemoglobin 12.8 12.0 - 15.0 g/dL   HCT 16.137.4 09.636.0 - 04.546.0 %   MCV 88.6 78.0 - 100.0 fL   MCH 30.3 26.0 - 34.0 pg   MCHC 34.2 30.0 - 36.0 g/dL   RDW 40.913.3 81.111.5 - 91.415.5 %   Platelets 182 150 - 400 K/uL    Patient Active Problem List   Diagnosis Date Noted  . Indication for care in labor and delivery, antepartum 07/10/2017  . Supervision of other normal pregnancy, antepartum 02/18/2017  . Late prenatal care affecting pregnancy 02/18/2017  . Obesity in pregnancy 02/18/2017    Assessment/Plan:  Shannon Clark is a 27 y.o. G2P0010 at 8475w0d here for IOL of post dates   #Labor: IOL for post dates , cytotec x1 placed vaginally , recheck in 2 hours possibly place foley bulb   #Pain: Per patient request #FWB: Category I #ID:  GBS neg  #MOF: breast  #MOC:IUD #Circ:  Girl   [name], MD  07/10/2017, 8:59 AM  CNM attestation:  I have seen and examined this patient; I agree with above documentation in the resident's note.   Shannon HoneyDaniele Shannon Clark is a 27 y.o. G2P0010 here for IOL due to postdates  PE: BP 128/79   Pulse 94   Temp 98.1 F (36.7 C) (Oral)   Resp 18   Ht 6' (1.829 m)   Wt 132.9 kg (293 lb)   LMP 09/26/2016   BMI 39.74 kg/m  Gen: calm comfortable, NAD Resp: normal effort, no distress Abd: gravid  ROS, labs, PMH reviewed  Plan: Plan cx ripening with cytotec/cervical foley prn, then Pit/AROM prn Anticipate SVD  Ancil Dewan CNM 07/10/2017, 2:45 PM

## 2017-07-10 NOTE — Progress Notes (Signed)
Shannon Clark is a 27 y.o. G2P0010 at 7281w0d by LMP admitted for induction of labor due to Post dates.  Subjective: Patient doing well. Feeling some contractions but tolerating them well.   Objective: BP 127/78   Pulse 80   Temp 98.1 F (36.7 C) (Oral)   Resp 18   Ht 6' (1.829 m)   Wt 293 lb (132.9 kg)   LMP 09/26/2016   BMI 39.74 kg/m  No intake/output data recorded. No intake/output data recorded.  FHT:  FHR: 145 bpm, variability: moderate,  accelerations:  Present,  decelerations:  Absent UC:   irregular, every 2-4 minutes SVE:   Dilation: 2.5 Effacement (%): 50 Station: -3 Exam by:: other(Chen Saadeh)  Labs: Lab Results  Component Value Date   WBC 10.8 (H) 07/10/2017   HGB 12.8 07/10/2017   HCT 37.4 07/10/2017   MCV 88.6 07/10/2017   PLT 182 07/10/2017    Assessment / Plan: IOL for post dates  Labor: s/p 2 doses of vaginal cytotec and 1 dose of buccal , FB placed  Preeclampsia:  n/a Fetal Wellbeing:  Category I Pain Control:  per patient request I/D:  n/a Anticipated MOD:  NSVD  Shannon Clark 07/10/2017, 6:11 PM

## 2017-07-10 NOTE — Progress Notes (Signed)
Shannon Clark is a 27 y.o. G2P0010 at 2132w0d by LMP admitted for induction of labor due to Post dates.  Subjective: Pateitn resting comfortable. Has had one dose of cytotec. On [place] .   Objective: BP 128/79   Pulse 94   Temp 98.1 F (36.7 C) (Oral)   Resp 18   Ht 6' (1.829 m)   Wt 293 lb (132.9 kg)   LMP 09/26/2016   BMI 39.74 kg/m  No intake/output data recorded. No intake/output data recorded.  FHT:  FHR: 145 bpm, variability: moderate,  accelerations:  Present,  decelerations:  Absent UC:   none SVE:   Dilation: 2 Effacement (%): 50 Station: -3 Exam by:: Philipp DeputyKim Shaw, RN  Labs: Lab Results  Component Value Date   WBC 10.8 (H) 07/10/2017   HGB 12.8 07/10/2017   HCT 37.4 07/10/2017   MCV 88.6 07/10/2017   PLT 182 07/10/2017    Assessment / Plan: IOL for post dates  Labor: cytotec x1 attempted to place FB unsuccessful  Placed additional cytotec  Preeclampsia:  n/a  Fetal Wellbeing:  Category I Pain Control:  per patient request  I/D:  n/a Anticipated MOD:  NSVD  Dominion Kathan 07/10/2017, 1:56 PM

## 2017-07-11 ENCOUNTER — Inpatient Hospital Stay (HOSPITAL_COMMUNITY): Payer: Medicaid Other | Admitting: Anesthesiology

## 2017-07-11 DIAGNOSIS — Z3A41 41 weeks gestation of pregnancy: Secondary | ICD-10-CM

## 2017-07-11 DIAGNOSIS — O480 Post-term pregnancy: Secondary | ICD-10-CM

## 2017-07-11 MED ORDER — IBUPROFEN 600 MG PO TABS
600.0000 mg | ORAL_TABLET | Freq: Four times a day (QID) | ORAL | Status: DC
  Administered 2017-07-11 – 2017-07-13 (×8): 600 mg via ORAL
  Filled 2017-07-11 (×9): qty 1

## 2017-07-11 MED ORDER — COCONUT OIL OIL
1.0000 "application " | TOPICAL_OIL | Status: DC | PRN
  Administered 2017-07-13: 1 via TOPICAL
  Filled 2017-07-11: qty 120

## 2017-07-11 MED ORDER — BENZOCAINE-MENTHOL 20-0.5 % EX AERO
1.0000 "application " | INHALATION_SPRAY | CUTANEOUS | Status: DC | PRN
  Filled 2017-07-11: qty 56

## 2017-07-11 MED ORDER — TETANUS-DIPHTH-ACELL PERTUSSIS 5-2.5-18.5 LF-MCG/0.5 IM SUSP: 0.5000 mL | Freq: Once | INTRAMUSCULAR | Status: DC

## 2017-07-11 MED ORDER — ACETAMINOPHEN 325 MG PO TABS: 650.0000 mg | ORAL_TABLET | ORAL | Status: DC | PRN

## 2017-07-11 MED ORDER — FENTANYL 2.5 MCG/ML BUPIVACAINE 1/10 % EPIDURAL INFUSION (WH - ANES)
INTRAMUSCULAR | Status: AC
  Filled 2017-07-11: qty 100

## 2017-07-11 MED ORDER — ZOLPIDEM TARTRATE 5 MG PO TABS: 5.0000 mg | ORAL_TABLET | Freq: Every evening | ORAL | Status: DC | PRN

## 2017-07-11 MED ORDER — DIPHENHYDRAMINE HCL 50 MG/ML IJ SOLN: 12.5000 mg | INTRAMUSCULAR | Status: DC | PRN

## 2017-07-11 MED ORDER — EPHEDRINE 5 MG/ML INJ
10.0000 mg | INTRAVENOUS | Status: DC | PRN
  Filled 2017-07-11: qty 2

## 2017-07-11 MED ORDER — PRENATAL MULTIVITAMIN CH
1.0000 | ORAL_TABLET | Freq: Every day | ORAL | Status: DC
  Administered 2017-07-11 – 2017-07-13 (×3): 1 via ORAL
  Filled 2017-07-11 (×4): qty 1

## 2017-07-11 MED ORDER — LACTATED RINGERS IV SOLN
500.0000 mL | Freq: Once | INTRAVENOUS | Status: AC
  Administered 2017-07-11: 500 mL via INTRAVENOUS

## 2017-07-11 MED ORDER — PHENYLEPHRINE 40 MCG/ML (10ML) SYRINGE FOR IV PUSH (FOR BLOOD PRESSURE SUPPORT)
80.0000 ug | PREFILLED_SYRINGE | INTRAVENOUS | Status: DC | PRN
  Filled 2017-07-11: qty 5

## 2017-07-11 MED ORDER — WITCH HAZEL-GLYCERIN EX PADS: 1.0000 "application " | MEDICATED_PAD | CUTANEOUS | Status: DC | PRN

## 2017-07-11 MED ORDER — FENTANYL 2.5 MCG/ML BUPIVACAINE 1/10 % EPIDURAL INFUSION (WH - ANES)
14.0000 mL/h | INTRAMUSCULAR | Status: DC | PRN
  Administered 2017-07-11: 14 mL/h via EPIDURAL

## 2017-07-11 MED ORDER — LIDOCAINE HCL (PF) 1 % IJ SOLN
INTRAMUSCULAR | Status: DC | PRN
  Administered 2017-07-11 (×2): 5 mL

## 2017-07-11 MED ORDER — PHENYLEPHRINE 40 MCG/ML (10ML) SYRINGE FOR IV PUSH (FOR BLOOD PRESSURE SUPPORT)
PREFILLED_SYRINGE | INTRAVENOUS | Status: AC
  Filled 2017-07-11: qty 20

## 2017-07-11 MED ORDER — SIMETHICONE 80 MG PO CHEW: 80.0000 mg | CHEWABLE_TABLET | ORAL | Status: DC | PRN

## 2017-07-11 MED ORDER — DIBUCAINE 1 % RE OINT: 1.0000 "application " | TOPICAL_OINTMENT | RECTAL | Status: DC | PRN

## 2017-07-11 MED ORDER — ONDANSETRON HCL 4 MG/2ML IJ SOLN: 4.0000 mg | INTRAMUSCULAR | Status: DC | PRN

## 2017-07-11 MED ORDER — DIPHENHYDRAMINE HCL 25 MG PO CAPS: 25.0000 mg | ORAL_CAPSULE | Freq: Four times a day (QID) | ORAL | Status: DC | PRN

## 2017-07-11 MED ORDER — ONDANSETRON HCL 4 MG PO TABS: 4.0000 mg | ORAL_TABLET | ORAL | Status: DC | PRN

## 2017-07-11 MED ORDER — SENNOSIDES-DOCUSATE SODIUM 8.6-50 MG PO TABS
2.0000 | ORAL_TABLET | ORAL | Status: DC
  Administered 2017-07-11 – 2017-07-13 (×2): 2 via ORAL
  Filled 2017-07-11 (×2): qty 2

## 2017-07-11 NOTE — Anesthesia Postprocedure Evaluation (Signed)
Anesthesia Post Note  Patient: Shannon Clark  Procedure(s) Performed: AN AD HOC LABOR EPIDURAL     Patient location during evaluation: Mother Baby Anesthesia Type: Spinal Level of consciousness: awake Pain management: satisfactory to patient Vital Signs Assessment: post-procedure vital signs reviewed and stable Respiratory status: spontaneous breathing Cardiovascular status: stable Anesthetic complications: no    Last Vitals:  Vitals:   07/11/17 0901 07/11/17 0940  BP: (!) 120/49 122/70  Pulse: 91 92  Resp: 18 16  Temp:  37 C  SpO2:      Last Pain:  Vitals:   07/11/17 1353  TempSrc:   PainSc: 1    Pain Goal:                 KeyCorpBURGER,Jereline Ticer

## 2017-07-11 NOTE — Progress Notes (Signed)
Labor Progress Note Shannon Clark is a 27 y.o. G2P0010 at 7636w1d presented for IOL for post dates S: no complaints  O:  BP (!) 121/57 (BP Location: Right Arm)   Pulse 85   Temp 99.4 F (37.4 C) (Oral)   Resp 16   Ht 6' (1.829 m)   Wt 132.9 kg (293 lb)   LMP 09/26/2016   BMI 39.74 kg/m  EFM: 155/mod var/+accels, no decels  CVE: Dilation: 4 Effacement (%): 50 Cervical Position: Posterior Station: -3 Presentation: Vertex Exam by:: Shannon Clark, CNM   A&P: 27 y.o. G2P0010 7036w1d IOL for post dates #Labor: Progressing well. Titrate pitocin #Pain: epidural upon request #FWB: reactive NST #GBS negative  Shannon BailiffParker W Rylin Saez, MD 2:20 AM

## 2017-07-11 NOTE — Anesthesia Preprocedure Evaluation (Signed)

## 2017-07-11 NOTE — Anesthesia Procedure Notes (Signed)
Epidural Patient location during procedure: OB  Staffing Anesthesiologist: Royale Swamy, MD Performed: anesthesiologist   Preanesthetic Checklist Completed: patient identified, site marked, surgical consent, pre-op evaluation, timeout performed, IV checked, risks and benefits discussed and monitors and equipment checked  Epidural Patient position: sitting Prep: DuraPrep Patient monitoring: heart rate, continuous pulse ox and blood pressure Approach: right paramedian Location: L3-L4 Injection technique: LOR saline  Needle:  Needle type: Tuohy  Needle gauge: 17 G Needle length: 9 cm and 9 Needle insertion depth: 9 cm Catheter type: closed end flexible Catheter size: 20 Guage Catheter at skin depth: 14 cm Test dose: negative  Assessment Events: blood not aspirated, injection not painful, no injection resistance, negative IV test and no paresthesia  Additional Notes Patient identified. Risks/Benefits/Options discussed with patient including but not limited to bleeding, infection, nerve damage, paralysis, failed block, incomplete pain control, headache, blood pressure changes, nausea, vomiting, reactions to medication both or allergic, itching and postpartum back pain. Confirmed with bedside nurse the patient's most recent platelet count. Confirmed with patient that they are not currently taking any anticoagulation, have any bleeding history or any family history of bleeding disorders. Patient expressed understanding and wished to proceed. All questions were answered. Sterile technique was used throughout the entire procedure. Please see nursing notes for vital signs. Test dose was given through epidural needle and negative prior to continuing to dose epidural or start infusion. Warning signs of high block given to the patient including shortness of breath, tingling/numbness in hands, complete motor block, or any concerning symptoms with instructions to call for help. Patient was given  instructions on fall risk and not to get out of bed. All questions and concerns addressed with instructions to call with any issues.     

## 2017-07-11 NOTE — Anesthesia Postprocedure Evaluation (Signed)
Anesthesia Post Note  [facility]  Procedure(s) Performed: AN AD HOC LABOR EPIDURAL     Anesthesia Post Evaluation  Last Vitals:  Vitals:   07/11/17 0901 07/11/17 0940  BP: (!) 120/49 122/70  Pulse: 91 92  Resp: 18 16  Temp:  37 C  SpO2:      Last Pain:  Vitals:   07/11/17 1353  TempSrc:   PainSc: 1    Pain Goal:                 KeyCorpBURGER,Xia Stohr

## 2017-07-11 NOTE — Progress Notes (Signed)
Labor Progress Note Shannon HoneyDaniele Clark is a 27 y.o. G2P0010 at 2187w1d presented for IOL for post dates S: more comfortable with epidural  O:  BP (!) 116/57   Pulse 90   Temp 98.5 F (36.9 C) (Oral)   Resp 16   Ht 6' (1.829 m)   Wt 132.9 kg (293 lb)   LMP 09/26/2016   SpO2 100%   BMI 39.74 kg/m   CVE: Dilation: 7 Effacement (%): 80 Cervical Position: Middle Station: -2 Presentation: Vertex Exam by:: Leeland, MD   A&P: 27 y.o. G2P0010 4287w1d  IOL for post dates  #Labor: Progressing well. AROM at 0440. #Pain: epidural #FWB: reactive NST #GBS negative   Alroy BailiffParker W Markeis Allman, MD 4:49 AM

## 2017-07-12 MED ORDER — IBUPROFEN 600 MG PO TABS: 600.0000 mg | ORAL_TABLET | Freq: Four times a day (QID) | ORAL | 0 refills | Status: AC

## 2017-07-12 NOTE — Discharge Summary (Signed)
OB Discharge Summary  Patient Name: Shannon Clark Criger DOB: 03/11/1991 MRN: [id number]  Date of admission: 07/10/2017 Delivering MD: Alroy BailiffLELAND, PARKER W   Date of discharge: 07/12/2017  Admitting diagnosis: INDUCTION Intrauterine pregnancy: 6370w2d     Secondary diagnosis:Active Problems:   Indication for care in labor and delivery, antepartum   Normal delivery  Additional problems:none     Discharge diagnosis: Term Pregnancy Delivered                                                                     Post partum procedures:n/a  Augmentation: Pitocin  Complications: None  Hospital course:  Induction of Labor With Vaginal Delivery   27 y.o. yo G2P0010 at 2070w2d was admitted to the hospital 07/10/2017 for induction of labor.  Indication for induction: Postdates.  Patient had an uncomplicated labor course as follows: Membrane Rupture Time/Date: 4:42 AM ,07/11/2017   Intrapartum Procedures: Episiotomy: None [1]                                         Lacerations:  2nd degree [3];Perineal [11]  Patient had delivery of a Viable infant.  Information for the patient's newborn:  Vanetta MuldersHoffmaster, Girl Daryl EasternDaniele [[id number]][[id number]]  Delivery Method: Vaginal, Spontaneous(Filed from Delivery Summary)   07/11/2017  Details of delivery can be found in separate delivery note.  Patient had a routine postpartum course. Patient is discharged home 07/12/17.  Physical exam  Vitals:   07/11/17 1353 07/11/17 1743 07/12/17 0000 07/12/17 0520  BP: 128/71 117/68 133/75 115/65  Pulse: 94 98 91 77  Resp: 18 18 18 18   Temp: 98.8 F (37.1 C) 98.2 F (36.8 C) 98.5 F (36.9 C) 98.2 F (36.8 C)  TempSrc: Oral Oral Oral Oral  SpO2:   99% 98%  Weight:    283 lb 0.5 oz (128.4 kg)  Height:       General: alert, cooperative and no distress Lochia: appropriate Uterine Fundus: firm Incision: N/A DVT Evaluation: No evidence of DVT seen on physical exam. Labs: Lab Results  Component Value Date   WBC 10.8 (H)  07/10/2017   HGB 12.8 07/10/2017   HCT 37.4 07/10/2017   MCV 88.6 07/10/2017   PLT 182 07/10/2017   CMP Latest Ref Rng & Units 02/18/2017  Glucose 65 - 99 mg/dL 66  BUN 6 - 20 mg/dL 7  Creatinine 3.320.57 - 9.511.00 mg/dL 8.84(Z0.54(L)  Sodium 660134 - 630144 mmol/L 139  Potassium 3.5 - 5.2 mmol/L 4.0  Chloride 96 - 106 mmol/L 102  CO2 20 - 29 mmol/L 23  Calcium 8.7 - 10.2 mg/dL 8.9  Total Protein 6.0 - 8.5 g/dL 6.5  Total Bilirubin 0.0 - 1.2 mg/dL <1.6<0.2  Alkaline Phos 39 - 117 IU/L 46  AST 0 - 40 IU/L 13  ALT 0 - 32 IU/L 10    Discharge instruction: per After Visit Summary and "Baby and Me Booklet".  After Visit Meds:  Allergies as of 07/12/2017   No Known Allergies     Medication List    TAKE these medications   ibuprofen 600 MG tablet Commonly known as:  ADVIL,MOTRIN Take 1  tablet (600 mg total) by mouth every 6 (six) hours.   PRENATAL VITAMINS PO Take 1 tablet by mouth daily.       Diet: routine diet  Activity: Advance as tolerated. Pelvic rest for 6 weeks.   Outpatient follow up:6 weeks Follow up Appt: Future Appointments  Date Time Provider Department Center  08/10/2017  9:55 AM Armando Reichert, CNM [facility]   Follow up visit: No Follow-up on file.  Postpartum contraception: IUD Mirena  Newborn Data: Live born female  Birth Weight: 7 lb 14.3 oz (3580 g) APGAR: 8, 9  Newborn Delivery   Birth date/time:  07/11/2017 07:28:00 Delivery type:  Vaginal, Spontaneous     Baby Feeding: Breast Disposition:home with mother   07/12/2017 Wyvonnia Dusky, CNM

## 2017-07-12 NOTE — Lactation Note (Signed)
This note was copied from a baby's chart. Lactation Consultation Note  Patient Name: Shannon Clark [id number]'QToday's Date: 07/12/2017 Reason for consult: Initial assessment;Primapara;1st time breastfeeding;Term  7434 hours old female who is being exclusively BF by her mother; she's a P1. Baby was bundled when entering the room, 3 hours have already gone by since her last feeding. Talked to mom about feeding baby on cues at least 8-12 times in 24 hours. Discussed the importance of STS, encouraged mom to give baby an opportunity to show feeding cues placing her STS. Baby latched on and was able to sustain latch with audible swallows. Mom stated the feedings at the breast were comfortable and she didn't have any pain or discomfort on her nipples. Taught her how to hand express; she was able to get some drops of colostrum and fed them back to baby. Baby was happy. Mom also have a DEBP at home, discussed engorgement prevention and treatment. Reviewed BF brochure, BF resources and feeding diary, mom is aware of LC services and will call if needed.  Maternal Data Formula Feeding for Exclusion: No Has patient been taught Hand Expression?: Yes Does the patient have breastfeeding experience prior to this delivery?: No  Feeding Feeding Type: Breast Fed Length of feed: 15 min  LATCH Score Latch: Grasps breast easily, tongue down, lips flanged, rhythmical sucking.  Audible Swallowing: A few with stimulation  Type of Nipple: Everted at rest and after stimulation  Comfort (Breast/Nipple): Soft / non-tender  Hold (Positioning): Assistance needed to correctly position infant at breast and maintain latch.  LATCH Score: 8  Interventions Interventions: Breast feeding basics reviewed;Assisted with latch;Skin to skin;Breast massage;Hand express;Breast compression;Adjust position;Support pillows;Position options;Expressed milk  Lactation Tools Discussed/Used WIC Program: No   Consult Status Consult  Status: Follow-up Date: 07/13/17 Follow-up type: In-patient    Khaleel Beckom Venetia ConstableS Nohely Whitehorn 07/12/2017, 5:49 PM

## 2017-07-13 ENCOUNTER — Encounter (HOSPITAL_COMMUNITY): Payer: Self-pay | Admitting: *Deleted

## 2017-07-13 NOTE — Progress Notes (Signed)
Post Partum Day 2 Subjective: no complaints, up ad lib, voiding, tolerating PO and + flatus  Objective: Blood pressure 123/72, pulse 69, temperature 98.4 F (36.9 C), temperature source Oral, resp. rate 18, height 6' (1.829 m), weight 283 lb 0.5 oz (128.4 kg), last menstrual period 09/26/2016, SpO2 100 %. Breastfeeding with no difficulties.  Physical Exam:  General: alert, cooperative and no distress Lochia: appropriate Uterine Fundus: firm Incision: n/a DVT Evaluation: No evidence of DVT seen on physical exam. Negative Homan's sign. No cords or calf tenderness. No significant calf/ankle edema.  Assessment/Plan: Discharge home, Breastfeeding and Contraception Mirena IUD (postpartum)  PP visit with [facility] in 6 wks D/C Summary done by Marlynn Perking. Lawson, CNM on 07/12/17  LOS: 3 days   Shannon Moraolitta Bradin Mcadory, MSN, CNM 07/13/2017, 9:31 AM

## 2017-08-10 ENCOUNTER — Encounter: Payer: Self-pay | Admitting: *Deleted

## 2017-08-10 ENCOUNTER — Encounter: Payer: Self-pay | Admitting: Advanced Practice Midwife

## 2017-08-10 ENCOUNTER — Ambulatory Visit (INDEPENDENT_AMBULATORY_CARE_PROVIDER_SITE_OTHER): Payer: Medicaid Other | Admitting: Advanced Practice Midwife

## 2017-08-10 DIAGNOSIS — Z3009 Encounter for other general counseling and advice on contraception: Secondary | ICD-10-CM

## 2017-08-10 DIAGNOSIS — Z1389 Encounter for screening for other disorder: Secondary | ICD-10-CM

## 2017-08-10 DIAGNOSIS — Z30430 Encounter for insertion of intrauterine contraceptive device: Secondary | ICD-10-CM | POA: Diagnosis not present

## 2017-08-10 LAB — POCT PREGNANCY, URINE: Preg Test, Ur: NEGATIVE

## 2017-08-10 MED ORDER — LEVONORGESTREL 19.5 MCG/DAY IU IUD
INTRAUTERINE_SYSTEM | Freq: Once | INTRAUTERINE | Status: AC
  Administered 2017-08-10: 11:00:00 via INTRAUTERINE

## 2017-08-10 NOTE — Progress Notes (Signed)
Pt interested in IUD but not sure of which one. After consulting with provider, Pt chose Liletta.

## 2017-08-10 NOTE — Progress Notes (Signed)
Subjective:     Shannon Clark is a 27 y.o. female who presents for a postpartum visit. She is 4 weeks postpartum following a spontaneous vaginal delivery. I have fully reviewed the prenatal and intrapartum course. The delivery was at 41/1 41 gestational weeks. Outcome: spontaneous vaginal delivery. Anesthesia: epidural. Postpartum course has been uncomplicated. Baby's course has been uncomplicated. Baby is feeding by breast. Bleeding staining only. Bowel function is normal. Bladder function is normal. Patient is not sexually active. Contraception method is none. Postpartum depression screening: negative.  The following portions of the patient's history were reviewed and updated as appropriate: allergies, current medications, past family history, past medical history, past social history, past surgical history and problem list.  Review of Systems Pertinent items are noted in HPI.   Objective:    There were no vitals taken for this visit.  General:  alert and cooperative   Breasts:  inspection negative, no nipple discharge or bleeding, no masses or nodularity palpable  Lungs: clear to auscultation bilaterally  Heart:  regular rate and rhythm  Abdomen: soft, non-tender; bowel sounds normal; no masses,  no organomegaly   Vulva:  normal  Vagina: normal vagina  Cervix:  multiparous appearance  Corpus: normal  Adnexa:  normal adnexa  Rectal Exam: Not performed.         GYNECOLOGY OFFICE PROCEDURE NOTE  Shannon Clark is a 27 y.o. G2P0010 here for [place] IUD insertion. No GYN concerns.  Last pap smear was on 02/2017 and was normal.  IUD Insertion Procedure Note Patient identified, informed consent performed, consent signed.   Discussed risks of irregular bleeding, cramping, infection, malpositioning or misplacement of the IUD outside the uterus which may require further procedure such as laparoscopy. Time out was performed.  Urine pregnancy test negative.  Speculum placed in the  vagina.  Cervix visualized.  Cleaned with Betadine x 2.  Grasped anteriorly with a single tooth tenaculum.  Uterus sounded to 8 cm.  Liletta IUD placed per manufacturer's recommendations.  Strings trimmed to 3 cm. Tenaculum was removed, good hemostasis noted.  Patient tolerated procedure well.   Patient was given post-procedure instructions.  She was advised to have backup contraception for one week.  Patient was also asked to check IUD strings periodically and follow up in 4 weeks for IUD check.   Assessment:    Normal postpartum exam. Pap smear not done at today's visit.    1. Postpartum care and examination   2. Birth control counseling   3. Encounter for IUD insertion      Plan:    1. Contraception: IUD 2. Routine care 3. Follow up in: 4 weeks or as needed.

## 2017-08-19 ENCOUNTER — Encounter: Payer: Self-pay | Admitting: Advanced Practice Midwife

## 2017-09-14 ENCOUNTER — Encounter: Payer: Self-pay | Admitting: Family Medicine

## 2017-09-14 ENCOUNTER — Ambulatory Visit (INDEPENDENT_AMBULATORY_CARE_PROVIDER_SITE_OTHER): Payer: Medicaid Other | Admitting: Family Medicine

## 2017-09-14 VITALS — BP 134/73 | HR 73 | Wt 281.4 lb

## 2017-09-14 DIAGNOSIS — Z30431 Encounter for routine checking of intrauterine contraceptive device: Secondary | ICD-10-CM

## 2017-09-14 NOTE — Progress Notes (Signed)
GYNECOLOGY OFFICE PROGRESS NOTE  History:  27 y.o. G2P0010 here today for today for IUD string check; Liletta IUD was placed  08/10/17. No complaints about the IUD, no concerning side effects. Some mild spotting.  The following portions of the patient's history were reviewed and updated as appropriate: allergies, current medications, past family history, past medical history, past social history, past surgical history and problem list. Last pap smear on 02/2017 was normal.  Review of Systems:  Pertinent items are noted in HPI.   Objective:  Physical Exam currently breastfeeding. CONSTITUTIONAL: Well-developed, well-nourished female in no acute distress.  CARDIOVASCULAR: Normal heart rate noted RESPIRATORY: Effort and breath sounds normal ABDOMEN: Soft, no distention noted.   PELVIC: Normal appearing external genitalia; normal appearing vaginal mucosa and cervix.  IUD strings visualized, about 2.5 cm in length outside cervix.   Assessment & Plan:  Normal IUD check. Patient to keep IUD in place for five years; can come in for removal if she desires pregnancy within the next five years. Routine preventative health maintenance measures emphasized.  Reva Boresanya S Christipher Rieger 11:35 AM 09/14/17

## 2018-04-08 IMAGING — US US MFM OB FOLLOW-UP
1 series · 14 of 28 positions shown · non-contrast
Comparison: none

[Series 1: us mfm ob follow-up · 14 of 45 slices shown]
[im 2/45]
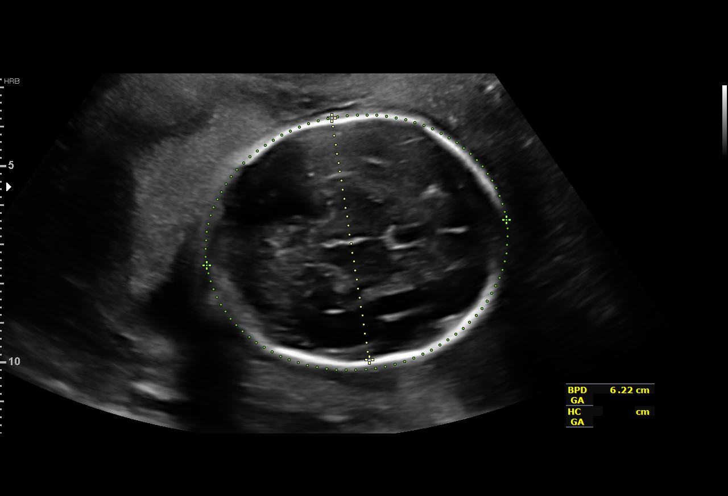
[im 5/45]
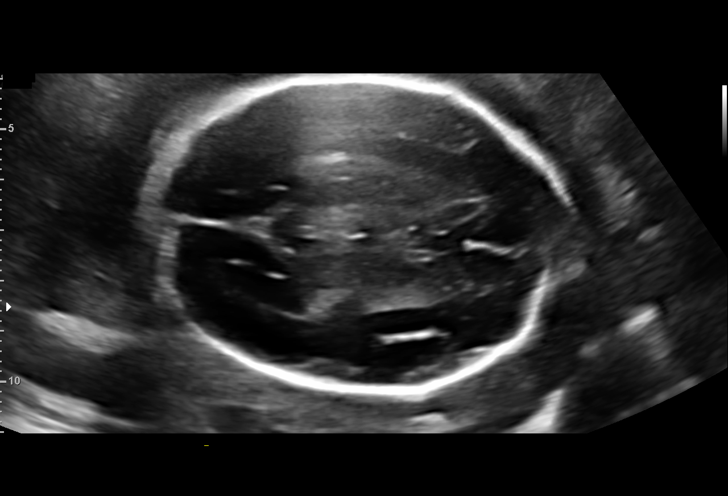
[im 9/45]
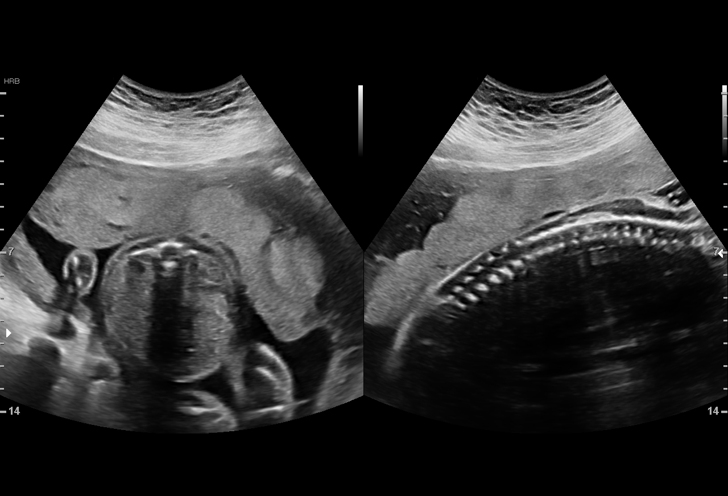
[im 12/45]
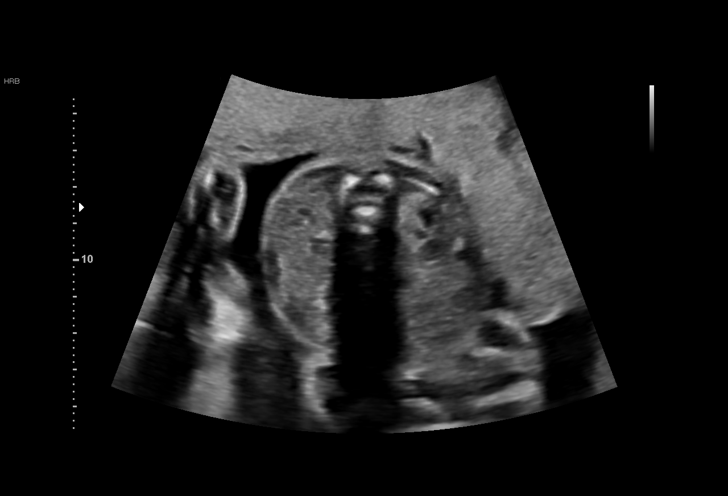
[im 15/45]
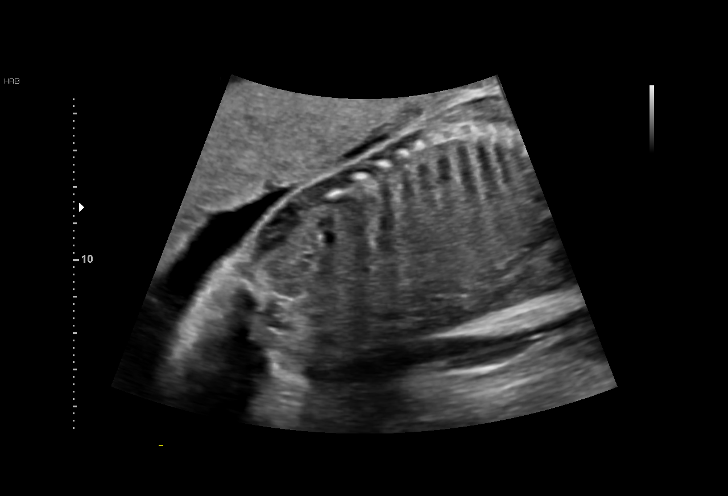
[im 18/45]
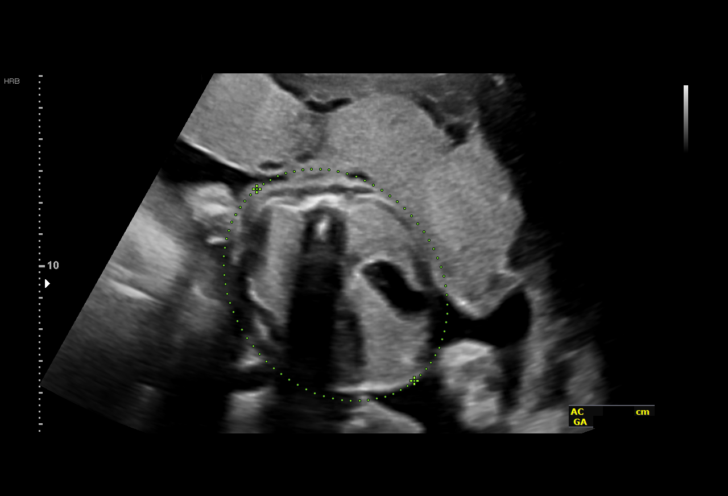
[im 22/45]
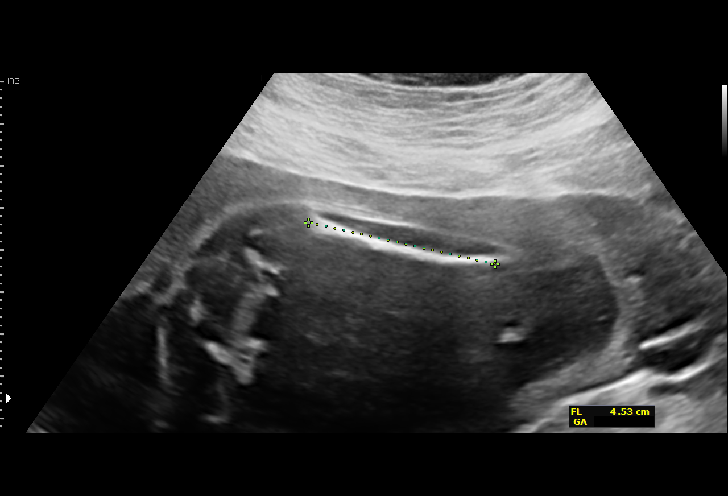
[im 25/45]
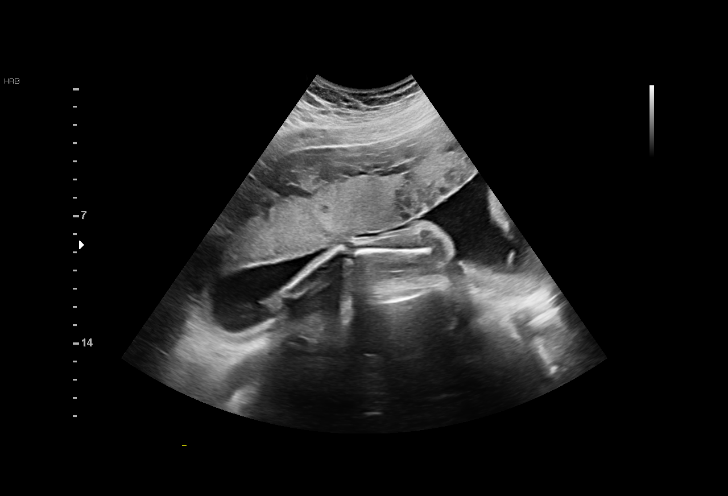
[im 28/45]
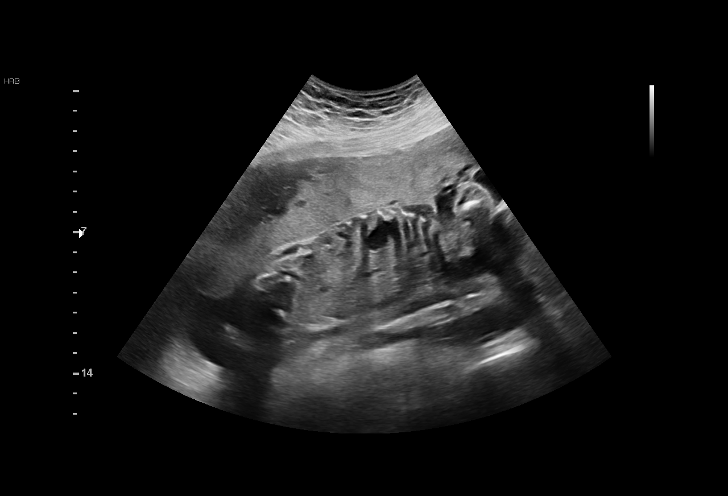
[im 31/45]
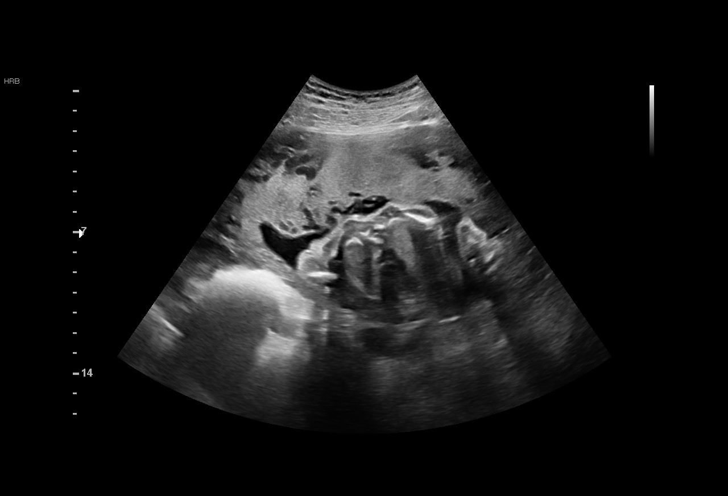
[im 35/45]
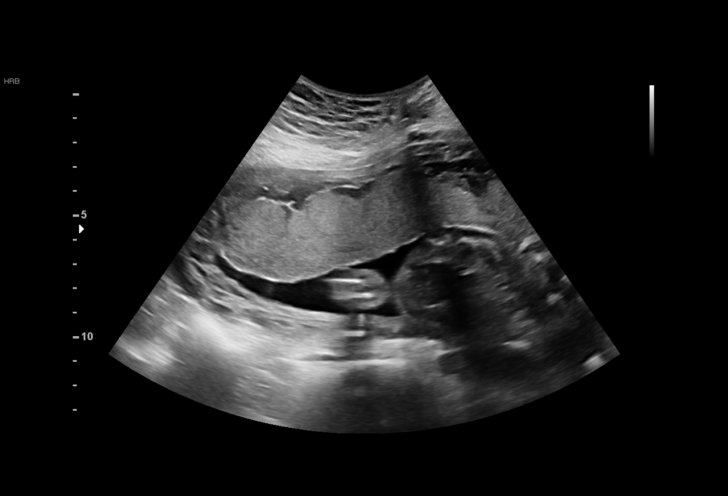
[im 38/45]
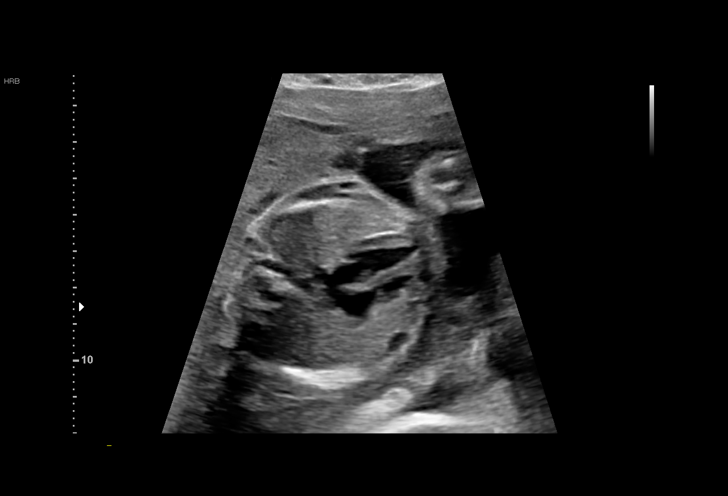
[im 41/45]
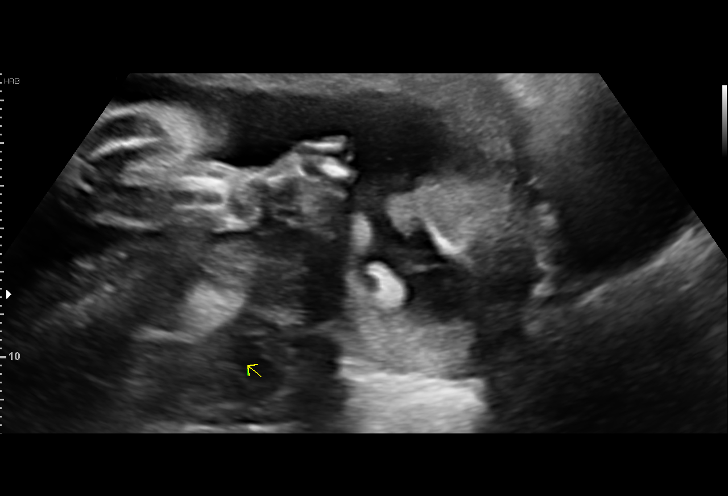
[im 45/45]
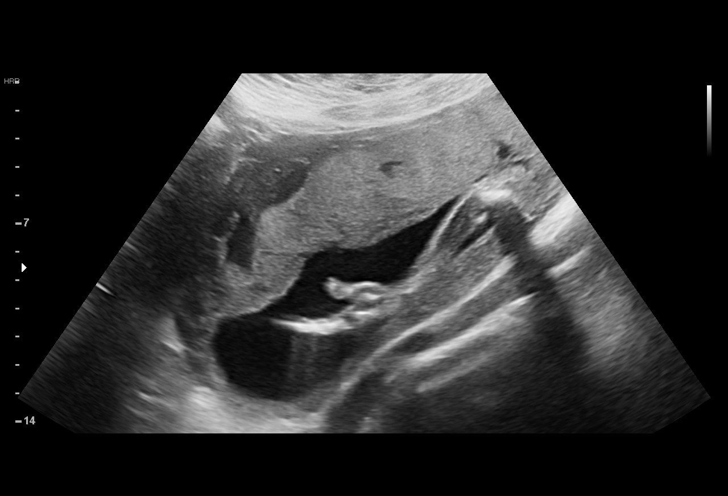

[14 of 28 positions shown; findings below may reference images not displayed]

1  SCARLITO HUHWA              [id number]      [id number]     [id number]
Indications

25 weeks gestation of pregnancy
Evaluate anatomy not seen on prior
sonogram
OB History

Blood Type:            Height:  6'0"   Weight (lb):  245       BMI:
Gravidity:    2         Term:   0        Prem:   0        SAB:   1
TOP:          0       Ectopic:  0        Living: 0
Fetal Evaluation

Num Of Fetuses:     1
Fetal Heart         144
Rate(bpm):
Cardiac Activity:   Observed
Presentation:       Cephalic
Placenta:           Anterior, above cervical os
P. Cord Insertion:  Previously Visualized

Amniotic Fluid
AFI FV:      Subjectively within normal limits

Largest Pocket(cm)
4.2
Biometry

BPD:      62.2  mm     G. Age:  25w 2d         29  %    CI:        76.32   %    70 - 86
FL/HC:      20.2   %    18.6 -
HC:      225.6  mm     G. Age:  24w 4d          6  %    HC/AC:      1.03        1.04 -
AC:      218.9  mm     G. Age:  26w 3d         65  %    FL/BPD:     73.3   %    71 - 87
FL:       45.6  mm     G. Age:  25w 1d         23  %    FL/AC:      20.8   %    20 - 24
HUM:      40.6  mm     G. Age:  24w 5d         21  %

Est. FW:     838  gm    1 lb 14 oz      55  %
Gestational Age

LMP:           25w 4d        Date:  09/26/16                 EDD:   07/03/17
U/S Today:     25w 3d                                        EDD:   07/04/17
Best:          25w 4d     Det. By:  LMP  (09/26/16)          EDD:   07/03/17
Anatomy

Cranium:               Appears normal         Aortic Arch:            Previously seen
Cavum:                 Appears normal         Ductal Arch:            Previously seen
Ventricles:            Appears normal         Diaphragm:              Previously seen
Choroid Plexus:        Previously seen        Stomach:                Appears normal, left
sided
Cerebellum:            Previously seen        Abdomen:                Appears normal
Posterior Fossa:       Previously seen        Abdominal Wall:         Previously seen
Nuchal Fold:           Previously seen        Cord Vessels:           Previously seen
Face:                  Orbits and profile     Kidneys:                Appear normal
previously seen
Lips:                  Previously seen        Bladder:                Appears normal
Thoracic:              Appears normal         Spine:                  Appears normal
Heart:                 Appears normal         Upper Extremities:      Previously seen
(4CH, axis, and situs
RVOT:                  Previously seen        Lower Extremities:      Previously seen
LVOT:                  Previously seen

Other:  Fetus appears to be a female. Technically difficult due to fetal position.
Cervix Uterus Adnexa

Cervix
Length:            3.1  cm.
Normal appearance by transabdominal scan.
Impression

Singleton intrauterine pregnancy at 25 weeks 4 days
gestation with fetal cardiac activity
Cephalic presentation
Anterior placenta
Normal appearing fetal growth and amniotic fluid volume
Completion of fetal anatomic survey
Normal appearing cervical length
Recommendations

Follow-up ultrasounds as clinically indicated.

## 2018-05-10 ENCOUNTER — Ambulatory Visit (INDEPENDENT_AMBULATORY_CARE_PROVIDER_SITE_OTHER): Payer: Medicaid Other | Admitting: Otolaryngology

## 2018-07-21 IMAGING — US US FETAL BPP W/ NON-STRESS
1 series · 13 of 13 positions shown · non-contrast
Comparison: none

[Series 1: us fetal bpp w/nonstress · 13 acquisitions, 13 frames shown]
[im 1/13]
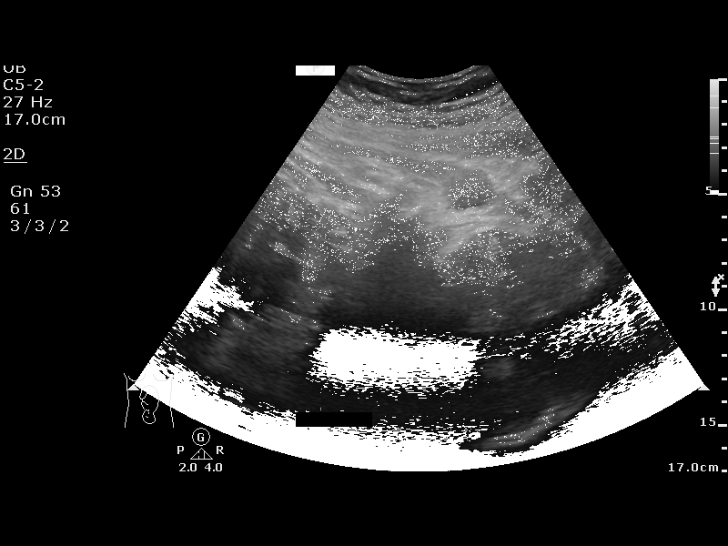
[im 2/13]
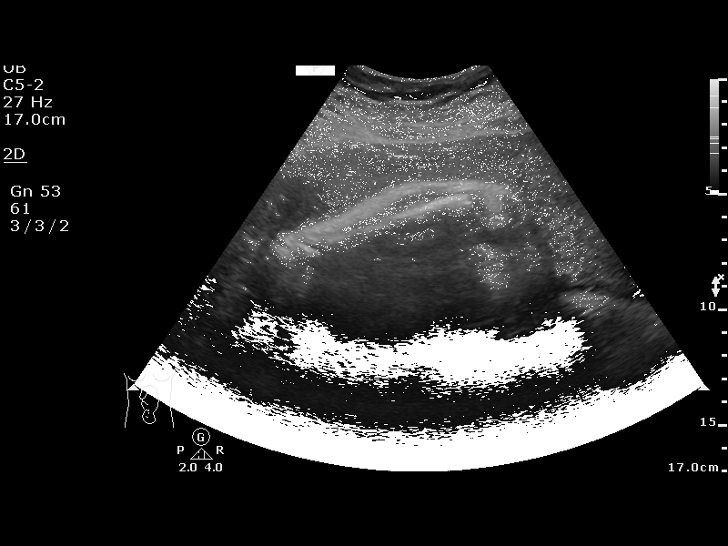
[im 3/13]
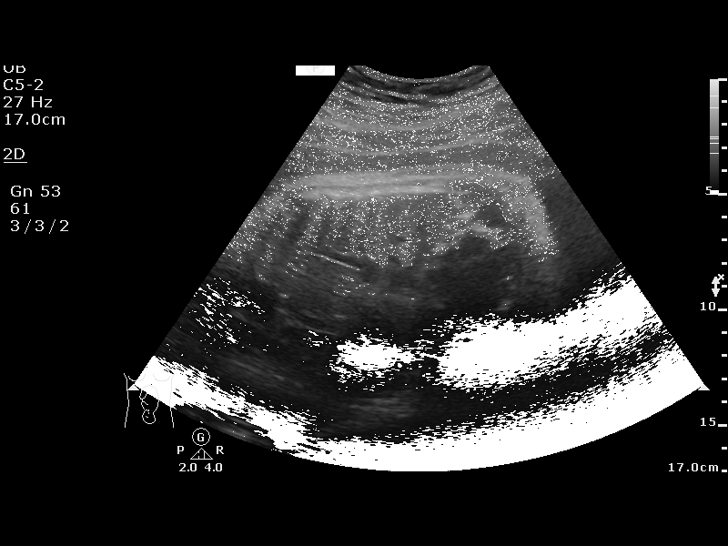
[im 4/13]
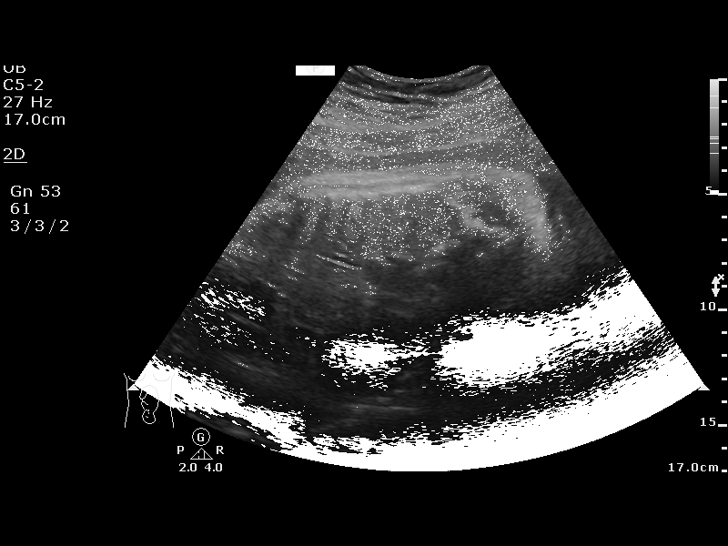
[im 5/13]
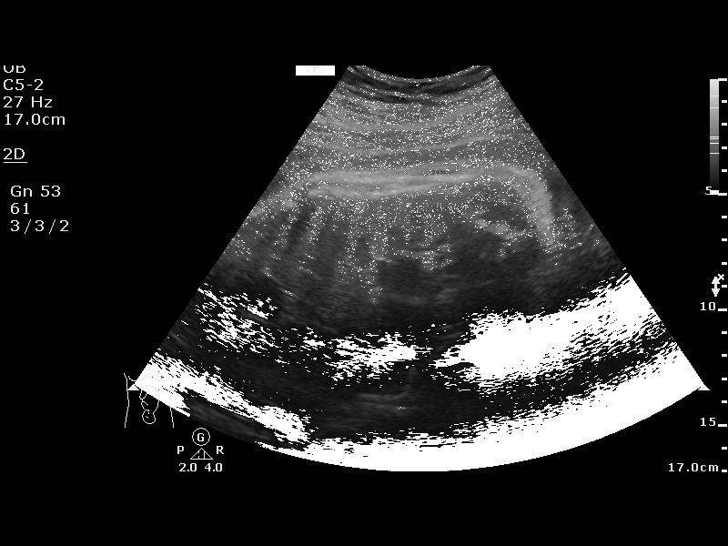
[im 6/13]
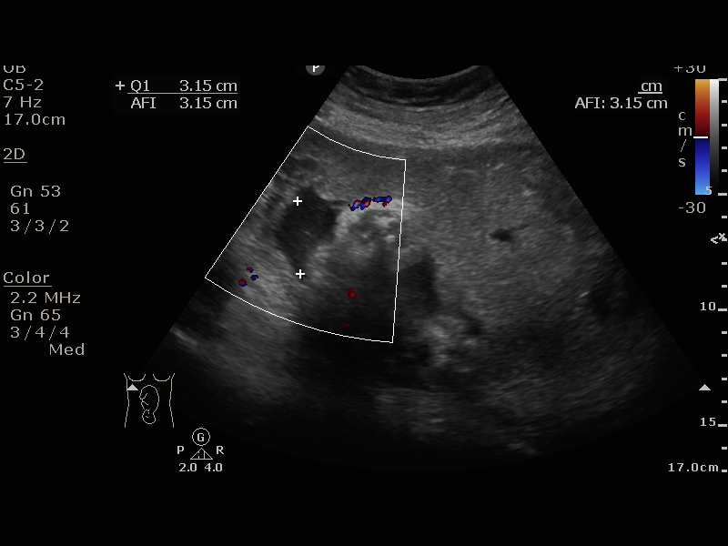
[im 7/13]
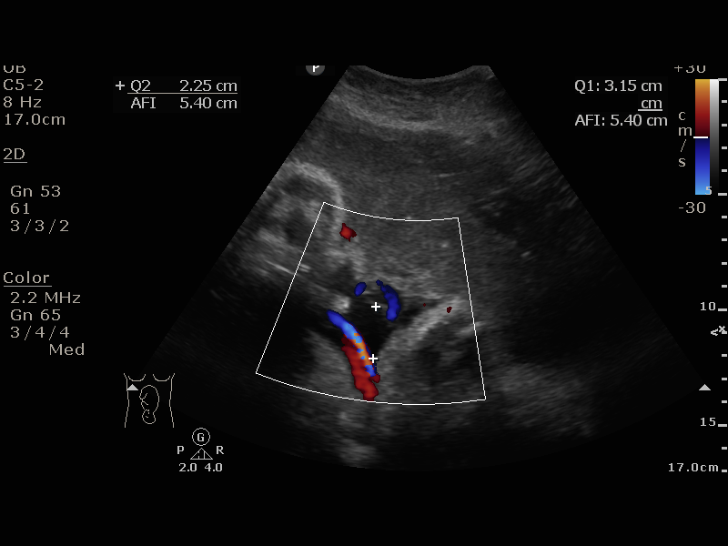
[im 8/13]
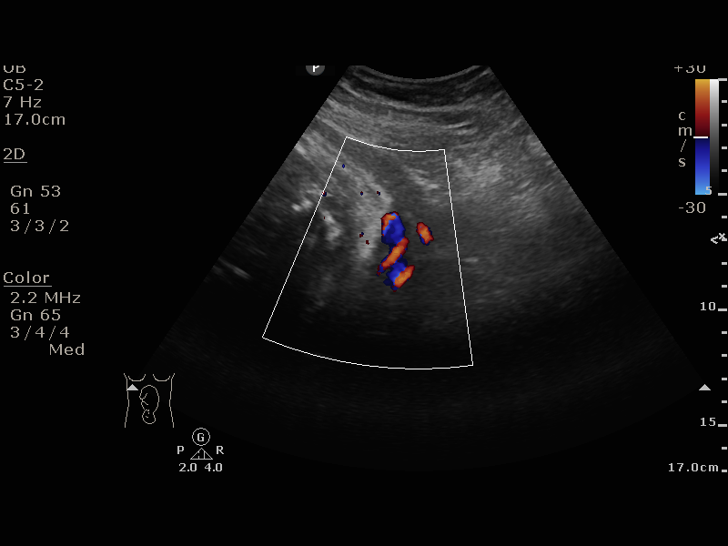
[im 9/13]
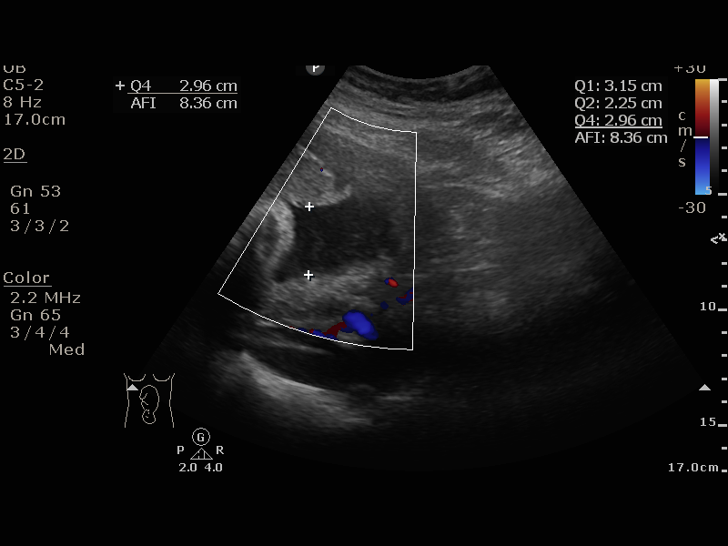
[im 10/13]
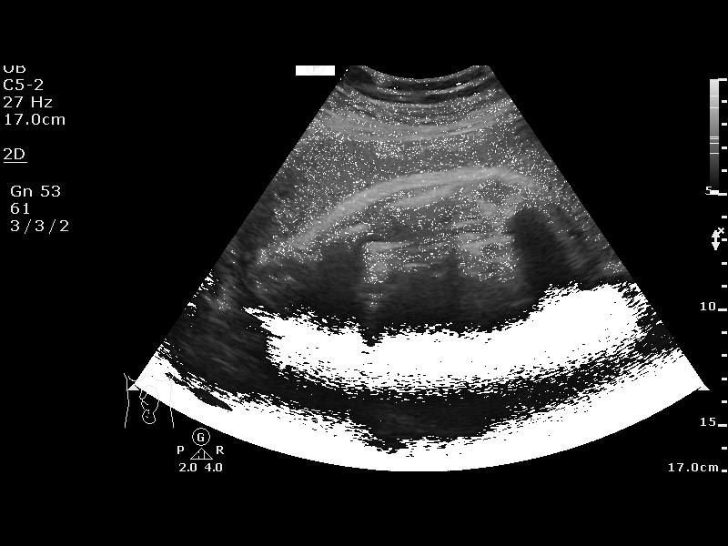
[im 11/13]
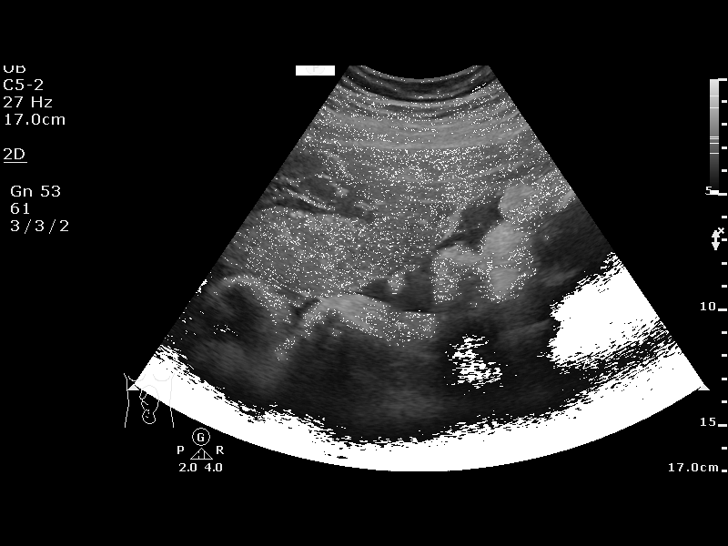
[im 12/13]
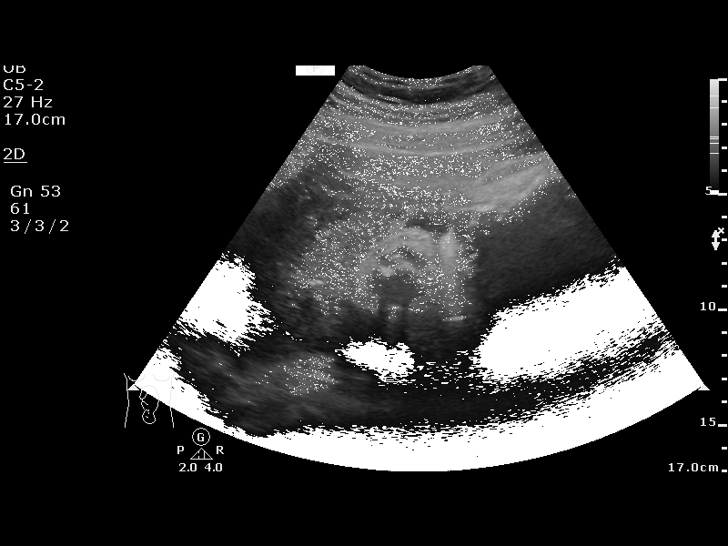
[im 13/13]
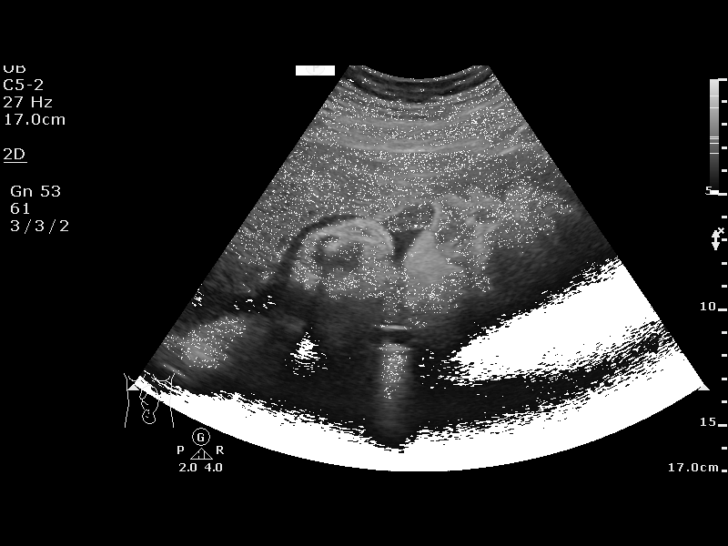

[13 of 13 positions shown; findings below may reference images not displayed]

Women's
[REDACTED]

1  US FETAL BPP W/NONSTRESS                    76818.4

1  KIKIN DZ             [id number]      [id number]     [id number]
Service(s) Provided

Indications

40 weeks gestation of pregnancy
Postdate pregnancy (40-42 weeks)
OB History

Blood Type:            Height:  6'0"   Weight (lb):  245       BMI:
Gravidity:    2         Term:   0        Prem:   0        SAB:   1
TOP:          0       Ectopic:  0        Living: 0
Fetal Evaluation

Num Of Fetuses:     1
Preg. Location:     Intrauterine
Cardiac Activity:   Observed
Presentation:       Cephalic

Amniotic Fluid
AFI FV:      Subjectively within normal limits

AFI Sum(cm)     %Tile       Largest Pocket(cm)
8.36            17

RUQ(cm)       RLQ(cm)       LUQ(cm)
3.15
Biophysical Evaluation

Amniotic F.V:   Pocket => 2 cm two         F. Tone:        Observed
planes
F. Movement:    Observed                   N.S.T:          Reactive
F. Breathing:   Observed                   Score:          [DATE]
Gestational Age

LMP:           40w 3d        Date:  09/26/16                 EDD:   07/03/17
Best:          40w 3d     Det. By:  LMP  (09/26/16)          EDD:   07/03/17
Impression

NST was reactive Single living intrauterine pregnancy at 40w
3d.
Cephalic presentation.
Normal amniotic fluid volume.

Recommendations

41 week induction is scheduled

## 2019-03-14 DIAGNOSIS — F902 Attention-deficit hyperactivity disorder, combined type: Secondary | ICD-10-CM | POA: Insufficient documentation

## 2020-04-26 ENCOUNTER — Other Ambulatory Visit: Payer: BC Managed Care – PPO

## 2020-04-26 DIAGNOSIS — Z20822 Contact with and (suspected) exposure to covid-19: Secondary | ICD-10-CM

## 2020-04-27 LAB — SARS-COV-2, NAA 2 DAY TAT

## 2020-04-27 LAB — NOVEL CORONAVIRUS, NAA: SARS-CoV-2, NAA: NOT DETECTED

## 2020-05-07 ENCOUNTER — Other Ambulatory Visit: Payer: BC Managed Care – PPO

## 2020-05-07 DIAGNOSIS — Z20822 Contact with and (suspected) exposure to covid-19: Secondary | ICD-10-CM

## 2020-05-08 LAB — NOVEL CORONAVIRUS, NAA: SARS-CoV-2, NAA: NOT DETECTED

## 2020-05-08 LAB — SARS-COV-2, NAA 2 DAY TAT

## 2020-10-17 ENCOUNTER — Ambulatory Visit (INDEPENDENT_AMBULATORY_CARE_PROVIDER_SITE_OTHER): Payer: BC Managed Care – PPO | Admitting: Family Medicine

## 2020-10-17 ENCOUNTER — Encounter: Payer: Self-pay | Admitting: Family Medicine

## 2020-10-17 ENCOUNTER — Other Ambulatory Visit: Payer: Self-pay

## 2020-10-17 ENCOUNTER — Other Ambulatory Visit (HOSPITAL_COMMUNITY)
Admission: RE | Admit: 2020-10-17 | Discharge: 2020-10-17 | Disposition: A | Discharge status: Home / Self Care | Payer: BC Managed Care – PPO | Source: Ambulatory Visit | Attending: Family Medicine | Admitting: Family Medicine

## 2020-10-17 VITALS — BP 106/72 | HR 93 | Wt 259.9 lb

## 2020-10-17 DIAGNOSIS — Z124 Encounter for screening for malignant neoplasm of cervix: Secondary | ICD-10-CM | POA: Insufficient documentation

## 2020-10-17 DIAGNOSIS — Z30432 Encounter for removal of intrauterine contraceptive device: Secondary | ICD-10-CM

## 2020-10-17 DIAGNOSIS — Z113 Encounter for screening for infections with a predominantly sexual mode of transmission: Secondary | ICD-10-CM | POA: Diagnosis not present

## 2020-10-17 NOTE — Progress Notes (Signed)
  Subjective:     Shannon Clark is a 30 y.o. female and is here for a comprehensive physical exam. The patient reports no problems. Has IUD in for 3 years. Is not currently sexually active. Having trouble losing weight and wants IUD removed.   The following portions of the patient's history were reviewed and updated as appropriate: allergies, current medications, past family history, past medical history, past social history, past surgical history and problem list.  Review of Systems Pertinent items noted in HPI and remainder of comprehensive ROS otherwise negative.   Objective:    BP 106/72   Pulse 93   Wt 259 lb 14.4 oz (117.9 kg)   LMP 10/07/2020   Breastfeeding No   BMI 35.25 kg/m  General appearance: alert, cooperative, appears stated age and moderately obese Head: Normocephalic, without obvious abnormality, atraumatic Neck: supple, symmetrical, trachea midline Lungs: normal effort Heart: regular rate and rhythm Abdomen: soft, non-tender; bowel sounds normal; no masses,  no organomegaly Pelvic: cervix normal in appearance, external genitalia normal, no adnexal masses or tenderness, no cervical motion tenderness, uterus normal size, shape, and consistency, vagina normal without discharge and IUD tip and strings noted Extremities: Homans sign is negative, no sign of DVT Pulses: 2+ and symmetric Skin: Skin color, texture, turgor normal. No rashes or lesions Neurologic: Grossly normal    Procedure: Speculum placed inside vagina.  Cervix visualized.  Strings grasped with ring forceps.  IUD removed intact.  Assessment:    Healthy female exam.      Plan:     Papanicolaou smear for cervical cancer screening - Plan: Cytology - PAP( [facility])  Screening examination for STD (sexually transmitted disease) - Plan: HIV Antibody (routine testing w rflx), RPR, Hepatitis C Antibody, Hepatitis B Surface AntiGEN  Encounter for IUD removal will use condoms, or call for  alternative if becomes sexually active again.  Return in 1 year (on 10/17/2021).   See After Visit Summary for Counseling Recommendations

## 2020-10-17 NOTE — Patient Instructions (Signed)
Preventive Care 11-30 Years Old, Female Preventive care refers to lifestyle choices and visits with your health care provider that can promote health and wellness. This includes:  A yearly physical exam. This is also called an annual wellness visit.  Regular dental and eye exams.  Immunizations.  Screening for certain conditions.  Healthy lifestyle choices, such as: ? Eating a healthy diet. ? Getting regular exercise. ? Not using drugs or products that contain nicotine and tobacco. ? Limiting alcohol use. What can I expect for my preventive care visit? Physical exam Your health care provider may check your:  Height and weight. These may be used to calculate your BMI (body mass index). BMI is a measurement that tells if you are at a healthy weight.  Heart rate and blood pressure.  Body temperature.  Skin for abnormal spots. Counseling Your health care provider may ask you questions about your:  Past medical problems.  Family's medical history.  Alcohol, tobacco, and drug use.  Emotional well-being.  Home life and relationship well-being.  Sexual activity.  Diet, exercise, and sleep habits.  Work and work Statistician.  Access to firearms.  Method of birth control.  Menstrual cycle.  Pregnancy history. What immunizations do I need? Vaccines are usually given at various ages, according to a schedule. Your health care provider will recommend vaccines for you based on your age, medical history, and lifestyle or other factors, such as travel or where you work.   What tests do I need? Blood tests  Lipid and cholesterol levels. These may be checked every 5 years starting at age 64.  Hepatitis C test.  Hepatitis B test. Screening  Diabetes screening. This is done by checking your blood sugar (glucose) after you have not eaten for a while (fasting).  STD (sexually transmitted disease) testing, if you are at risk.  BRCA-related cancer screening. This may  be done if you have a family history of breast, ovarian, tubal, or peritoneal cancers.  Pelvic exam and Pap test. This may be done every 3 years starting at age 61. Starting at age 82, this may be done every 5 years if you have a Pap test in combination with an HPV test. Talk with your health care provider about your test results, treatment options, and if necessary, the need for more tests.   Follow these instructions at home: Eating and drinking  Eat a healthy diet that includes fresh fruits and vegetables, whole grains, lean protein, and low-fat dairy products.  Take vitamin and mineral supplements as recommended by your health care provider.  Do not drink alcohol if: ? Your health care provider tells you not to drink. ? You are pregnant, may be pregnant, or are planning to become pregnant.  If you drink alcohol: ? Limit how much you have to 0-1 drink a day. ? Be aware of how much alcohol is in your drink. In the [place] one drink equals one 12 oz bottle of beer (355 mL), one 5 oz glass of wine (148 mL), or one 1 oz glass of hard liquor (44 mL).   Lifestyle  Take daily care of your teeth and gums. Brush your teeth every morning and night with fluoride toothpaste. Floss one time each day.  Stay active. Exercise for at least 30 minutes 5 or more days each week.  Do not use any products that contain nicotine or tobacco, such as cigarettes, e-cigarettes, and chewing tobacco. If you need help quitting, ask your health care provider.  Do  not use drugs.  If you are sexually active, practice safe sex. Use a condom or other form of protection to prevent STIs (sexually transmitted infections).  If you do not wish to become pregnant, use a form of birth control. If you plan to become pregnant, see your health care provider for a prepregnancy visit.  Find healthy ways to cope with stress, such as: ? Meditation, yoga, or listening to music. ? Journaling. ? Talking to a trusted  person. ? Spending time with friends and family. Safety  Always wear your seat belt while driving or riding in a vehicle.  Do not drive: ? If you have been drinking alcohol. Do not ride with someone who has been drinking. ? When you are tired or distracted. ? While texting.  Wear a helmet and other protective equipment during sports activities.  If you have firearms in your house, make sure you follow all gun safety procedures.  Seek help if you have been physically or sexually abused. What's next?  Go to your health care provider once a year for an annual wellness visit.  Ask your health care provider how often you should have your eyes and teeth checked.  Stay up to date on all vaccines. This information is not intended to replace advice given to you by your health care provider. Make sure you discuss any questions you have with your health care provider. Document Revised: 01/22/2020 Document Reviewed: 02/04/2018 Elsevier Patient Education  2021 [employer].

## 2020-10-19 LAB — CYTOLOGY - PAP
Chlamydia: NEGATIVE
Comment: NEGATIVE
Comment: NEGATIVE
Comment: NORMAL
Diagnosis: NEGATIVE
Neisseria Gonorrhea: NEGATIVE
Trichomonas: NEGATIVE

## 2020-10-22 LAB — HEPATITIS C ANTIBODY: Hep C Virus Ab: <0.1 s/co ratio (ref 0.0–0.9)

## 2020-10-22 LAB — HEPATITIS B SURFACE ANTIGEN: Hepatitis B Surface Ag: NEGATIVE

## 2020-10-22 LAB — HIV ANTIBODY (ROUTINE TESTING W REFLEX): HIV Screen 4th Generation wRfx: NONREACTIVE

## 2020-10-22 LAB — RPR: RPR Ser Ql: NONREACTIVE
